# Patient Record
Sex: Male | Born: 1960 | Race: Black or African American | Hispanic: No | Marital: Single | Smoking: Current every day smoker
Health system: Southern US, Community
[De-identification: ages and names within clinical notes are randomized; demographics above are authoritative.]

## PROBLEM LIST (undated history)

## (undated) DIAGNOSIS — M109 Gout, unspecified: Secondary | ICD-10-CM

---

## 2002-11-03 ENCOUNTER — Emergency Department (HOSPITAL_COMMUNITY): Admission: EM | Admit: 2002-11-03 | Discharge: 2002-11-03 | Payer: Self-pay | Admitting: Emergency Medicine

## 2005-01-07 ENCOUNTER — Ambulatory Visit: Payer: Self-pay | Admitting: Family Medicine

## 2005-02-11 ENCOUNTER — Ambulatory Visit: Payer: Self-pay | Admitting: Family Medicine

## 2005-03-07 ENCOUNTER — Emergency Department (HOSPITAL_COMMUNITY): Admission: EM | Admit: 2005-03-07 | Discharge: 2005-03-07 | Payer: Self-pay | Admitting: Emergency Medicine

## 2005-05-31 ENCOUNTER — Emergency Department (HOSPITAL_COMMUNITY): Admission: EM | Admit: 2005-05-31 | Discharge: 2005-06-01 | Payer: Self-pay | Admitting: Emergency Medicine

## 2007-11-13 ENCOUNTER — Emergency Department (HOSPITAL_COMMUNITY): Admission: EM | Admit: 2007-11-13 | Discharge: 2007-11-14 | Payer: Self-pay | Admitting: Emergency Medicine

## 2007-12-19 ENCOUNTER — Emergency Department (HOSPITAL_COMMUNITY): Admission: EM | Admit: 2007-12-19 | Discharge: 2007-12-19 | Payer: Self-pay | Admitting: *Deleted

## 2008-03-28 ENCOUNTER — Ambulatory Visit: Payer: Self-pay | Admitting: Family Medicine

## 2008-03-28 DIAGNOSIS — J309 Allergic rhinitis, unspecified: Secondary | ICD-10-CM | POA: Insufficient documentation

## 2008-12-08 ENCOUNTER — Ambulatory Visit: Payer: Self-pay | Admitting: Family Medicine

## 2008-12-08 DIAGNOSIS — N529 Male erectile dysfunction, unspecified: Secondary | ICD-10-CM | POA: Insufficient documentation

## 2008-12-08 DIAGNOSIS — F17200 Nicotine dependence, unspecified, uncomplicated: Secondary | ICD-10-CM | POA: Insufficient documentation

## 2009-07-17 ENCOUNTER — Ambulatory Visit: Payer: Self-pay | Admitting: Family Medicine

## 2009-07-17 DIAGNOSIS — M65849 Other synovitis and tenosynovitis, unspecified hand: Secondary | ICD-10-CM

## 2009-07-17 DIAGNOSIS — M65839 Other synovitis and tenosynovitis, unspecified forearm: Secondary | ICD-10-CM | POA: Insufficient documentation

## 2009-10-04 ENCOUNTER — Ambulatory Visit: Payer: Self-pay | Admitting: Family Medicine

## 2009-11-09 IMAGING — US US SCROTUM
1 series · 14 of 25 positions shown · non-contrast
Comparison: None

CLINICAL DATA: Right testicular pain, hematuria

SCROTAL ULTRASOUND
DOPPLER ULTRASOUND OF THE TESTICLES
TECHNIQUE: Complete ultrasound examination of the testicles,
epididymis, and other scrotal structures was performed.  Color and
spectral Doppler ultrasound were also utilized to evaluate blood
flow to the testicles.

[Series 1: unknown · 0.11mm/px · 14 of 51 slices shown]
[im 1/51]
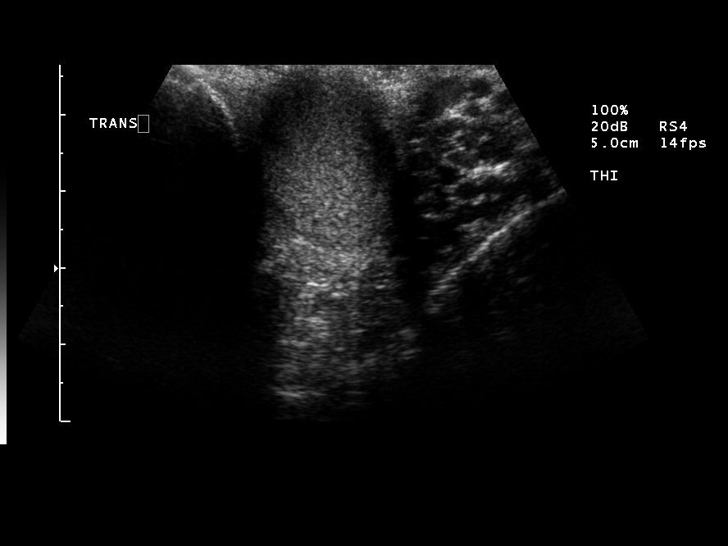
[im 5/51]
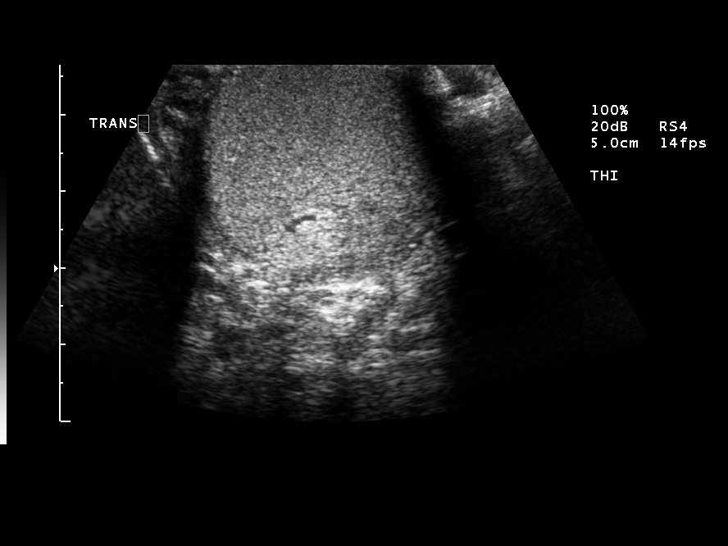
[im 9/51]
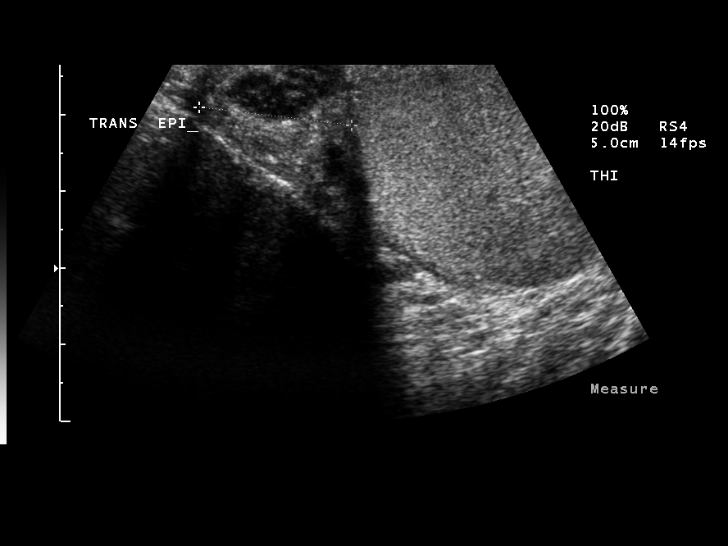
[im 13/51]
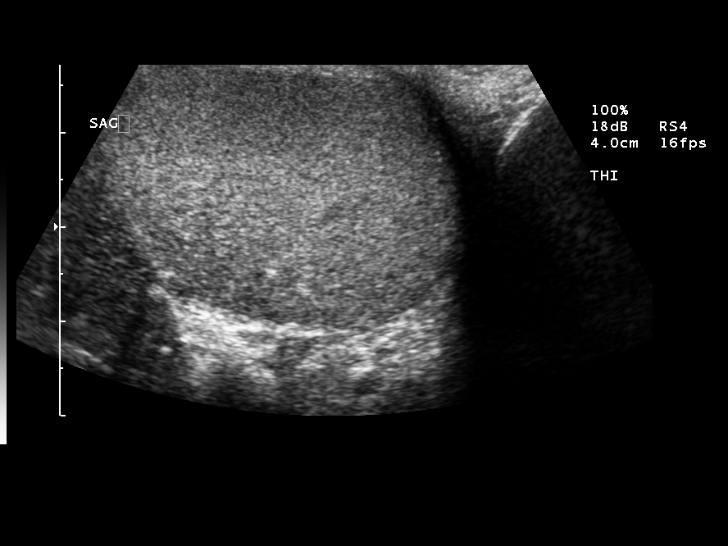
[im 17/51]
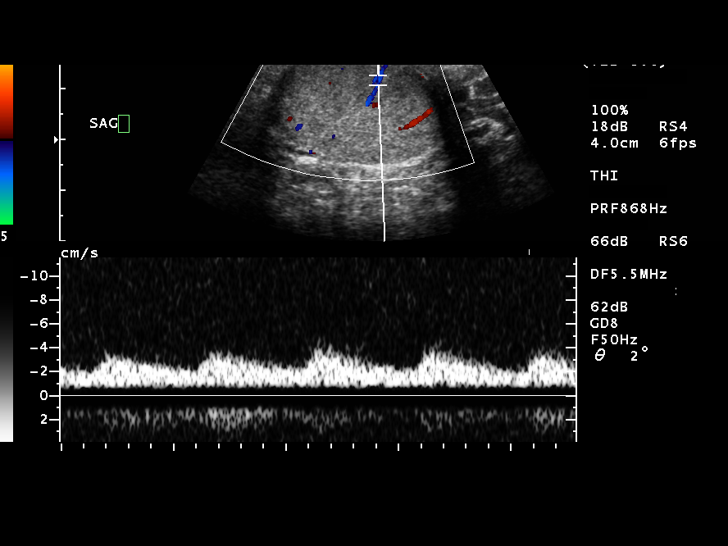
[im 19/51]
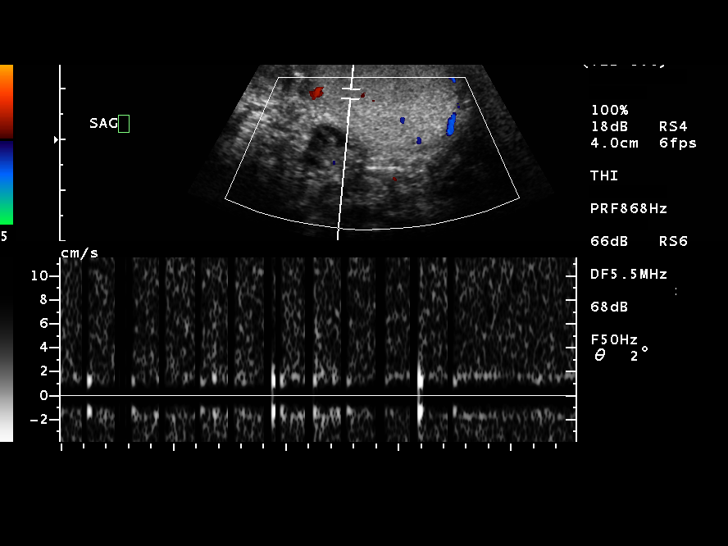
[im 23/51]
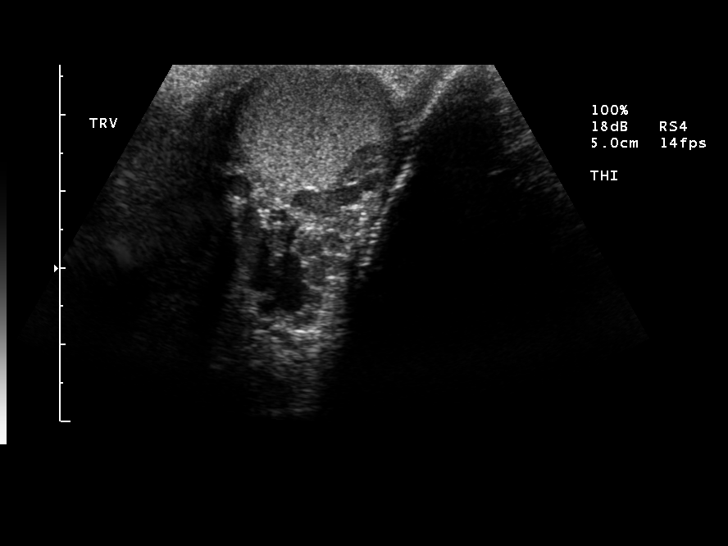
[im 28/51]
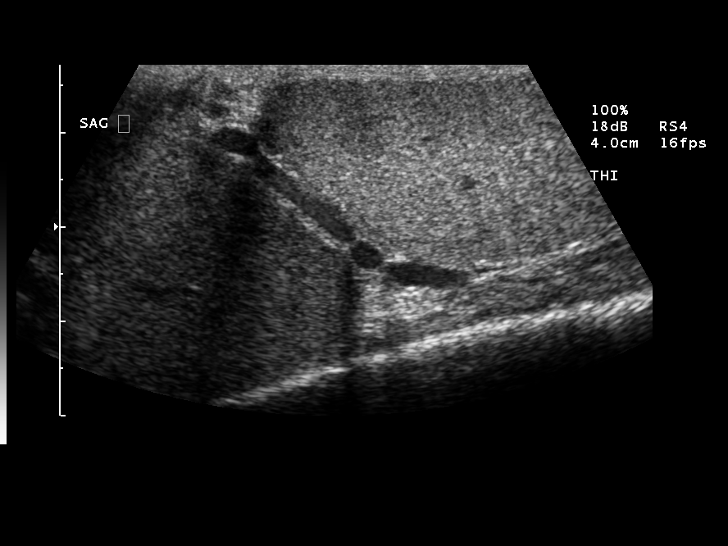
[im 32/51]
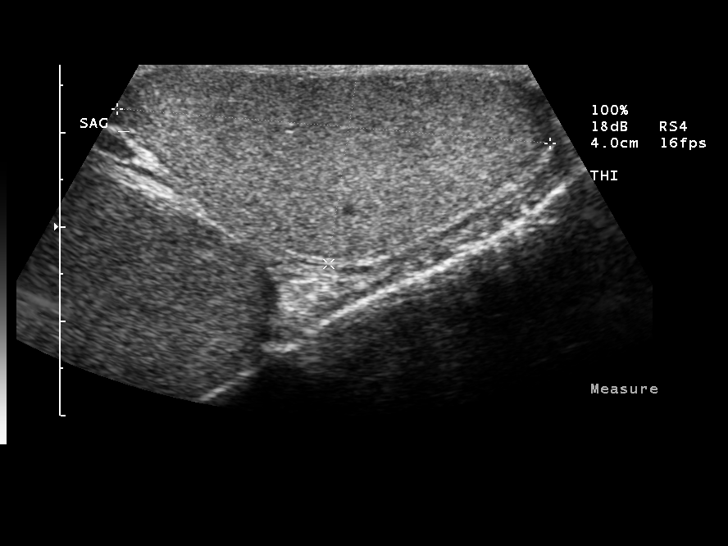
[im 34/51]
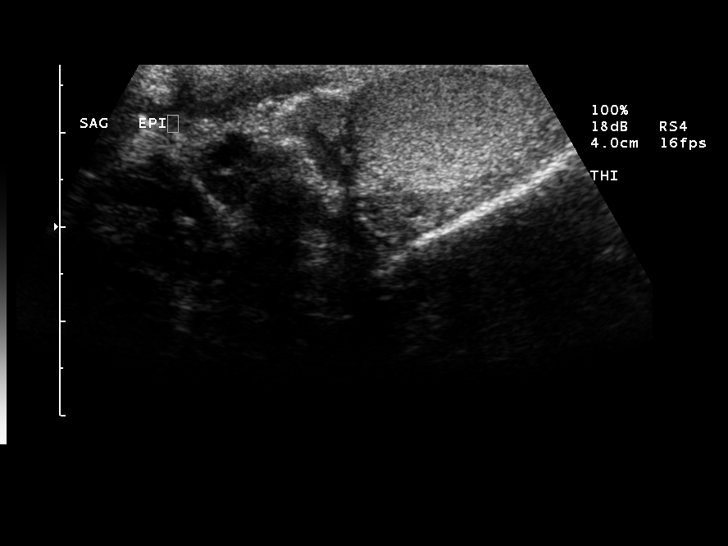
[im 38/51]
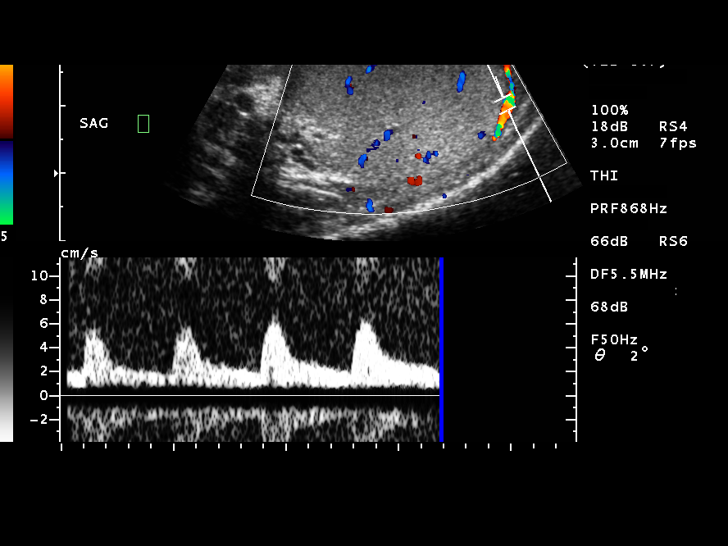
[im 42/51]
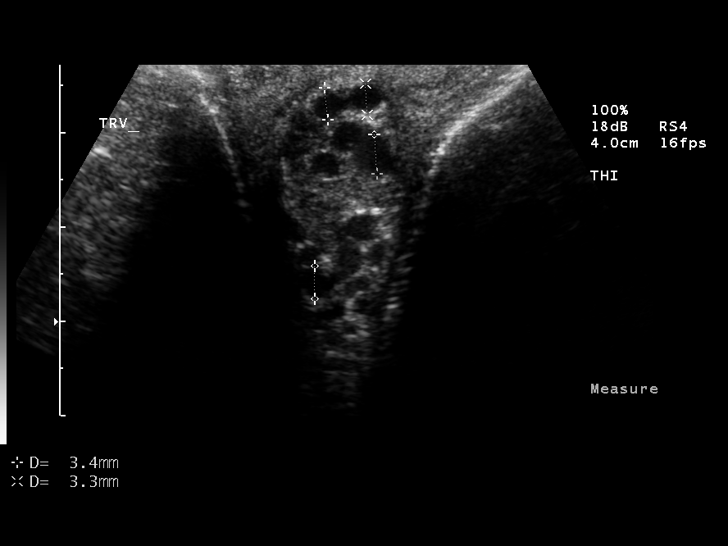
[im 46/51]
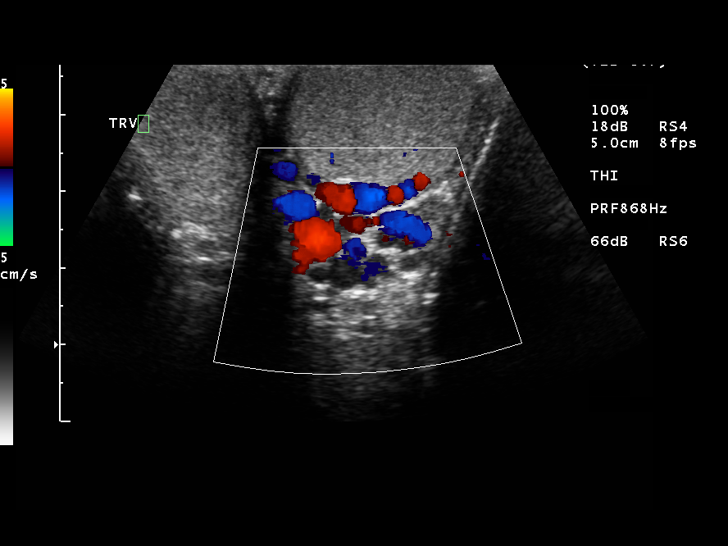
[im 51/51]
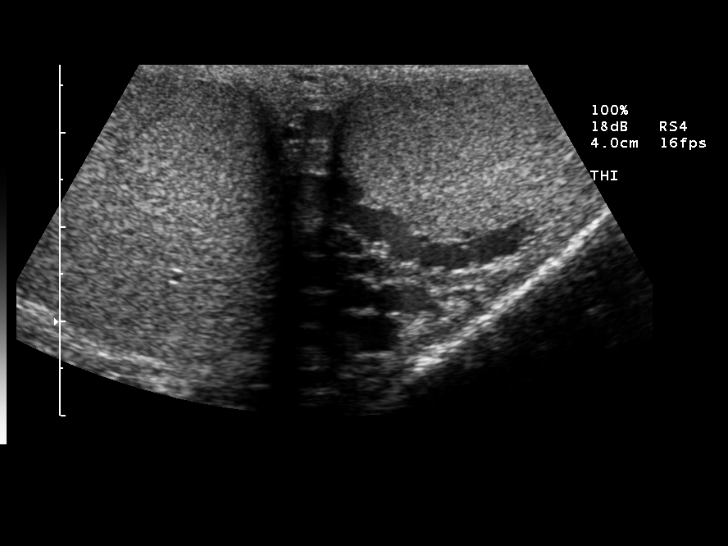

[14 of 25 positions shown; findings below may reference images not displayed]

FINDINGS: The testicles are normal in size and in echogenicity.  No
intratesticular abnormality is seen.  There is blood flow to both
testicles with arterial and venous wave forms demonstrated.  No
abnormality of the epididymis is seen and no hydrocele is noted.
Left varicocele is present.
IMPRESSION: 1.  No acute abnormality.  No intratesticular abnormality. There is
blood flow to both testicles.
2.  Left varicocele.

## 2010-02-05 NOTE — Assessment & Plan Note (Signed)
Summary: Left wrist swelling/cb   Vital Signs:  Patient profile:   50 year old male Weight:      166 pounds Temp:     99.1 degrees F oral BP sitting:   102 / 80  (left arm) Cuff size:   regular  Vitals Entered By: Kern Reap CMA Duncan Dull) (October 04, 2009 2:05 PM) CC: left wrist is swollen Is Patient Diabetic? No Pain Assessment Patient in pain? yes        CC:  left wrist is swollen.  History of Present Illness: luverne is a 71 -year-old male, smoker, who comes in today for evaluation of pain in his left wrist x 6 weeks.  We saw him about 6 weeks ago with pain in his left wrist.  We felt like he had tendinitis and gave him a short course of prednisone.  However, he states the soreness did not go away, and indeed, when he tapered off the prednisone, now it's gotten worse.  He is right-handed.  No history of trauma  Allergies: No Known Drug Allergies  Past History:  Past medical, surgical, family and social histories (including risk factors) reviewed for relevance to current acute and chronic problems.  Family History: Reviewed history and no changes required.  Social History: Reviewed history from 03/28/2008 and no changes required. Occupation: post office Married Never Smoked Alcohol use-no Drug use-no Regular exercise-yes  Review of Systems      See HPI  Physical Exam  General:  Well-developed,well-nourished,in no acute distress; alert,appropriate and cooperative throughout examination Msk:  right hand and wrist normal.  The left hand, extreme tenderness with flexion and extension   Impression & Recommendations:  Problem # 1:  TENDINITIS, LEFT WRIST (ICD-727.05) Assessment Deteriorated  Complete Medication List: 1)  Viagra 50 Mg Tabs (Sildenafil citrate) .... Uad 2)  Vicodin Es 7.5-750 Mg Tabs (Hydrocodone-acetaminophen) .... 1/2 to 1 q 4 hr, as needed  Patient Instructions: 1)  go to the [facility]  orthopedic office tomorrow at 915 for a 930 appointment  with Dr. Cleophas Dunker. 2)  Called Donnie at [phone] 4.  Preliminary data

## 2010-02-05 NOTE — Assessment & Plan Note (Signed)
Summary: WRIST PAIN//ALP   Vital Signs:  Patient profile:   50 year old male Height:      65 inches Weight:      171 pounds BMI:     28.56 Temp:     99.9 degrees F oral BP sitting:   130 / 90  (left arm) Cuff size:   regular  Vitals Entered By: Kern Reap CMA Duncan Dull) (July 17, 2009 12:50 PM) CC: left wrist swollen   CC:  left wrist swollen.  History of Present Illness: Kyle Mcbride is a 50 y/o m comes in today for evaluation of pain and swelling of his left wrist since Sunday.  His work involves heavy Youth worker.  Ununited work around 7 p.m. he was lifting and noticed severe pain in his left wrist.  He did not fall.  He's not had problems like this before.  He states his pain on a scale of one to 10 is a 10 however, he requests light duty.  He states he has to work  Allergies: No Known Drug Allergies  Past History:  Past medical, surgical, family and social histories (including risk factors) reviewed for relevance to current acute and chronic problems.  Family History: Reviewed history and no changes required.  Social History: Reviewed history from 03/28/2008 and no changes required. Occupation: post office Married Never Smoked Alcohol use-no Drug use-no Regular exercise-yes  Review of Systems      See HPI  Physical Exam  General:  Well-developed,well-nourished,in no acute distress; alert,appropriate and cooperative throughout examination Msk:  there is swelling and extreme tenderness of the left wrist.  No evidence of infection Pulses:  R and L carotid,radial,femoral,dorsalis pedis and posterior tibial pulses are full and equal bilaterally Extremities:  No clubbing, cyanosis, edema, or deformity noted with normal full range of motion of all joints.   Neurologic:  No cranial nerve deficits noted. Station and gait are normal. Plantar reflexes are down-going bilaterally. DTRs are symmetrical throughout. Sensory, motor and coordinative functions appear  intact.   Impression & Recommendations:  Problem # 1:  TENDINITIS, LEFT WRIST (ICD-727.05) Assessment New  Complete Medication List: 1)  Chantix Continuing Month Pak 1 Mg Tabs (Varenicline tartrate) .... Uad 2)  Viagra 50 Mg Tabs (Sildenafil citrate) .... Uad 3)  Prednisone 20 Mg Tabs (Prednisone) .... Uad 4)  Vicodin Es 7.5-750 Mg Tabs (Hydrocodone-acetaminophen) .... 1/2 to 1 q 4 hr, as needed  Patient Instructions: 1)  where a short arm splint continuously, prednisone two tabs daily x 3 days, one x 3 days, a half x 3 days, then half a tablet Monday, Wednesday, Friday, for a two week taper. 2)  Elevation and ice as often as possible and when your home, hydrocodone, one half to one tablet every 4 to 6 hours as needed for severe pain.  Return next Tuesday Prescriptions: VICODIN ES 7.5-750 MG TABS (HYDROCODONE-ACETAMINOPHEN) 1/2 to 1 q 4 hr, as needed  #30 x 1   Entered and Authorized by:   Roderick Pee MD   Signed by:   Roderick Pee MD on 07/17/2009   Method used:   Print then Give to Patient   RxID:   [id number] PREDNISONE 20 MG TABS (PREDNISONE) UAD  #30 x 1   Entered and Authorized by:   Roderick Pee MD   Signed by:   Roderick Pee MD on 07/17/2009   Method used:   Print then Give to Patient   RxID:   [id number]

## 2010-10-08 LAB — URINALYSIS, ROUTINE W REFLEX MICROSCOPIC
Glucose, UA: NEGATIVE
Nitrite: NEGATIVE
Protein, ur: NEGATIVE
Specific Gravity, Urine: 1.031 — ABNORMAL HIGH

## 2010-10-08 LAB — POCT I-STAT, CHEM 8
Calcium, Ion: 1.17
Creatinine, Ser: 1.7 — ABNORMAL HIGH
Glucose, Bld: 88
HCT: 46
Hemoglobin: 15.6
Potassium: 3.8
Sodium: 141

## 2010-10-08 LAB — URINE MICROSCOPIC-ADD ON

## 2011-01-03 ENCOUNTER — Ambulatory Visit (INDEPENDENT_AMBULATORY_CARE_PROVIDER_SITE_OTHER): Payer: Federal, State, Local not specified - PPO | Admitting: Family Medicine

## 2011-01-03 ENCOUNTER — Encounter: Payer: Self-pay | Admitting: Family Medicine

## 2011-01-03 VITALS — BP 110/80 | Temp 99.2°F | Ht 65.0 in | Wt 183.0 lb

## 2011-01-03 DIAGNOSIS — M109 Gout, unspecified: Secondary | ICD-10-CM | POA: Insufficient documentation

## 2011-01-03 MED ORDER — HYDROCODONE-ACETAMINOPHEN 7.5-750 MG PO TABS: 1.0000 | ORAL_TABLET | Freq: Four times a day (QID) | ORAL | Status: DC | PRN

## 2011-01-03 MED ORDER — PREDNISONE 20 MG PO TABS: ORAL_TABLET | ORAL | Status: DC

## 2011-01-03 MED ORDER — ALLOPURINOL 300 MG PO TABS: ORAL_TABLET | ORAL | Status: DC

## 2011-01-03 NOTE — Patient Instructions (Signed)
Beginning the prednisone as directed, to stop the acute attack of gout.  To prevent further attacks take the allopurinol, one tablet daily forever.  Elevation ice, and Vicodin one half to one tablet every 4 to 6 hours as needed for severe pain.  Return p.r.n.

## 2011-01-03 NOTE — Progress Notes (Signed)
  Subjective:    Patient ID: Kyle Mcbride, male    DOB: [birth year], 50 y.o.   MRN: [id number]  HPI Kyle Mcbride is a 50 year old male, who comes in today for evaluation of acute gout.  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  It that the medicine for 3 months then stopped it.  Tuesday of this week.  He began having severe pain and swelling in his left wrist.  No history of trauma   Review of Systems General and musculoskeletal review of systems otherwise negative    Objective:   Physical Exam Well-developed well-nourished man in no acute distress, severe pain, left wrist is red, hot, and swollen       Assessment & Plan:  Acute gout begin prednisone, and Vicodin to ameliorate the acute attack been allopurinol one daily forever

## 2011-08-02 ENCOUNTER — Other Ambulatory Visit: Payer: Self-pay | Admitting: Family Medicine

## 2011-09-07 ENCOUNTER — Emergency Department (HOSPITAL_BASED_OUTPATIENT_CLINIC_OR_DEPARTMENT_OTHER)
Admission: EM | Admit: 2011-09-07 | Discharge: 2011-09-07 | Disposition: A | Discharge status: Home / Self Care | Payer: Federal, State, Local not specified - PPO | Attending: Emergency Medicine | Admitting: Emergency Medicine

## 2011-09-07 ENCOUNTER — Emergency Department (HOSPITAL_BASED_OUTPATIENT_CLINIC_OR_DEPARTMENT_OTHER): Payer: Federal, State, Local not specified - PPO

## 2011-09-07 ENCOUNTER — Encounter (HOSPITAL_BASED_OUTPATIENT_CLINIC_OR_DEPARTMENT_OTHER): Payer: Self-pay | Admitting: *Deleted

## 2011-09-07 DIAGNOSIS — S99919A Unspecified injury of unspecified ankle, initial encounter: Secondary | ICD-10-CM | POA: Insufficient documentation

## 2011-09-07 DIAGNOSIS — S93409A Sprain of unspecified ligament of unspecified ankle, initial encounter: Secondary | ICD-10-CM

## 2011-09-07 DIAGNOSIS — Y998 Other external cause status: Secondary | ICD-10-CM | POA: Insufficient documentation

## 2011-09-07 DIAGNOSIS — F17200 Nicotine dependence, unspecified, uncomplicated: Secondary | ICD-10-CM | POA: Insufficient documentation

## 2011-09-07 DIAGNOSIS — S8990XA Unspecified injury of unspecified lower leg, initial encounter: Secondary | ICD-10-CM | POA: Insufficient documentation

## 2011-09-07 DIAGNOSIS — X500XXA Overexertion from strenuous movement or load, initial encounter: Secondary | ICD-10-CM | POA: Insufficient documentation

## 2011-09-07 DIAGNOSIS — Y9301 Activity, walking, marching and hiking: Secondary | ICD-10-CM | POA: Insufficient documentation

## 2011-09-07 MED ORDER — IBUPROFEN 800 MG PO TABS
800.0000 mg | ORAL_TABLET | Freq: Once | ORAL | Status: AC
  Administered 2011-09-07: 800 mg via ORAL
  Filled 2011-09-07: qty 1

## 2011-09-07 NOTE — ED Provider Notes (Signed)
History  This chart was scribed for Kyle Quarry, MD by Erskine Emery. This patient was seen in room MH01/MH01 and the patient's care was started at 20:05.   CSN: [id number]  Arrival date & time 09/07/11  1908   First MD Initiated Contact with Patient 09/07/11 2005      Chief Complaint  Patient presents with  . Ankle Pain    (Consider location/radiation/quality/duration/timing/severity/associated sxs/prior treatment) The history is provided by the patient. No language interpreter was used.  Kyle Mcbride is a 51 y.o. male who presents to the Emergency Department complaining of left ankle pain and swelling since an injury while running last night. Pt has been taking Vicodin with no relief from pain. Pt reports he has been ambulatory on the ankle since the event.  History reviewed. No pertinent past medical history.  History reviewed. No pertinent past surgical history.  History reviewed. No pertinent family history.  History  Substance Use Topics  . Smoking status: Current Everyday Smoker -- 0.5 packs/day  . Smokeless tobacco: Not on file  . Alcohol Use: Yes      Review of Systems  Constitutional: Negative for fever and chills.  Respiratory: Negative for shortness of breath.   Gastrointestinal: Negative for nausea and vomiting.  Musculoskeletal: Positive for joint swelling.       Left ankle pain  Neurological: Negative for weakness.  All other systems reviewed and are negative.    Allergies  Review of patient's allergies indicates not on file.  Home Medications   Current Outpatient Rx  Name Route Sig Dispense Refill  . ALLOPURINOL 300 MG PO TABS  One tab daily forever 100 tablet 3  . HYDROCODONE-ACETAMINOPHEN 7.5-750 MG PO TABS  take 1 tablet by mouth every 6 hours if needed for pain 20 tablet 0  . PREDNISONE 20 MG PO TABS  Three tabs now then two tabs x 3 days, one x 3 days, a half a tab x 3 days, then stop.   30 tablet 1  . SILDENAFIL CITRATE 50 MG PO TABS Oral  Take 50 mg by mouth daily as needed.        BP 116/69  Pulse 76  Temp 99 F (37.2 C) (Oral)  Resp 20  Ht 5\' 5"  (1.651 m)  Wt 155 lb (70.308 kg)  BMI 25.79 kg/m2  SpO2 97%  Physical Exam  Nursing note and vitals reviewed. Constitutional: He is oriented to person, place, and time. He appears well-developed and well-nourished. No distress.  HENT:  Head: Normocephalic and atraumatic.  Eyes: EOM are normal. Pupils are equal, round, and reactive to light.  Neck: Neck supple. No tracheal deviation present.  Cardiovascular: Normal rate.   Pulmonary/Chest: Effort normal. No respiratory distress.  Abdominal: Soft. He exhibits no distension.  Musculoskeletal: Normal range of motion. He exhibits no edema.       Tend over lateral aspect of his left ankle. Good distal pulses. Neurovascular is intact. Ligament laxity is not noted on exam. No proximal injections noted. Shows swelling consistent with a sprain.  Neurological: He is alert and oriented to person, place, and time.  Skin: Skin is warm and dry.  Psychiatric: He has a normal mood and affect.    ED Course  Procedures (including critical care time) DIAGNOSTIC STUDIES: Oxygen Saturation is 97% on room air, adequate by my interpretation.    COORDINATION OF CARE: 20:10--I evaluated the patient and we discussed a treatment plan including crutches, antiinflammatory medication, and resting to which the pt  agreed.    Labs Reviewed - No data to display Dg Ankle Complete Left  09/07/2011  *RADIOLOGY REPORT*  Clinical Data: Twisting injury to the left ankle.  Lateral pain.  LEFT ANKLE COMPLETE - 3+ VIEW  Comparison: None.  Findings: Mild lateral soft tissue swelling.  Large joint effusion. No evidence of acute fracture or dislocation.  Ankle mortise intact with well-preserved joint space.  Tiny plantar calcaneal spur and tiny enthesopathic spur at the insertion of the Achilles tendon on the posterior calcaneus.  IMPRESSION: No acute osseous  abnormality.  Large joint effusion.   Original Report Authenticated By: Arnell Sieving, M.D.      No diagnosis found.    MDM  I personally performed the services described in this documentation, which was scribed in my presence. The recorded information has been reviewed and considered.   Kyle Quarry, MD 09/07/11 [phone]

## 2011-09-07 NOTE — ED Notes (Addendum)
Pt states he twisted his left ankle last p.m. No relief with Vicodin

## 2011-10-09 ENCOUNTER — Ambulatory Visit (INDEPENDENT_AMBULATORY_CARE_PROVIDER_SITE_OTHER): Payer: Federal, State, Local not specified - PPO | Admitting: Family Medicine

## 2011-10-09 ENCOUNTER — Encounter: Payer: Self-pay | Admitting: Family Medicine

## 2011-10-09 VITALS — BP 120/88 | Temp 98.6°F | Wt 181.0 lb

## 2011-10-09 DIAGNOSIS — M109 Gout, unspecified: Secondary | ICD-10-CM

## 2011-10-09 DIAGNOSIS — N529 Male erectile dysfunction, unspecified: Secondary | ICD-10-CM

## 2011-10-09 DIAGNOSIS — F17200 Nicotine dependence, unspecified, uncomplicated: Secondary | ICD-10-CM

## 2011-10-09 MED ORDER — ALLOPURINOL 300 MG PO TABS: ORAL_TABLET | ORAL | Status: DC

## 2011-10-09 MED ORDER — HYDROCODONE-ACETAMINOPHEN 7.5-750 MG PO TABS: 1.0000 | ORAL_TABLET | ORAL | Status: DC | PRN

## 2011-10-09 MED ORDER — PREDNISONE 20 MG PO TABS: ORAL_TABLET | ORAL | Status: DC

## 2011-10-09 NOTE — Patient Instructions (Signed)
Gone home now and elevation and ice  Vicodin,,,,,,,,, one half to one tablet every 4 hours as needed for severe pain  Do not go to work  Prednisone as directed  Allopurinol one tablet daily starting tomorrow morning  Return on Monday for followup

## 2011-10-09 NOTE — Progress Notes (Signed)
  Subjective:    Patient ID: Kyle Mcbride, male    DOB: 1960/04/17, 51 y.o.   MRN: [id number]  HPI Kyle Mcbride is a 51 year old male smoker who comes in today for evaluation of acute gout in his left knee  He states about the 2 weeks ago he had some soreness in his left ankle that lasted for 3 days and went away with a short course of prednisone. Then a week ago he had the sudden onset of pain in his left knee. He's not been taking his allopurinol. No history of trauma. 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   Review of Systems Gen. an orthopedic review of systems otherwise negative    Objective:   Physical Exam Well-developed well-nourished male in no acute distress examination of left ankle normal left knee is swollen and red hot with an obvious effusion       Assessment & Plan:  Acute gout left knee plan elevation ice prednisone Vicodin for pain return on Monday for followup  At that juncture we'll discuss erectile dysfunction and tobacco abuse

## 2011-10-10 ENCOUNTER — Other Ambulatory Visit: Payer: Self-pay | Admitting: Family Medicine

## 2011-10-10 ENCOUNTER — Ambulatory Visit (INDEPENDENT_AMBULATORY_CARE_PROVIDER_SITE_OTHER)
Admission: RE | Admit: 2011-10-10 | Discharge: 2011-10-10 | Disposition: A | Discharge status: Home / Self Care | Payer: Federal, State, Local not specified - PPO | Source: Ambulatory Visit | Attending: Family Medicine | Admitting: Family Medicine

## 2011-10-10 DIAGNOSIS — M25562 Pain in left knee: Secondary | ICD-10-CM

## 2011-10-10 DIAGNOSIS — M25569 Pain in unspecified knee: Secondary | ICD-10-CM

## 2011-10-10 LAB — CBC WITH DIFFERENTIAL/PLATELET
Eosinophils Absolute: 0 10*3/uL (ref 0.0–0.7)
Eosinophils Relative: 0.2 % (ref 0.0–5.0)
HCT: 44.6 % (ref 39.0–52.0)
Lymphocytes Relative: 18.2 % (ref 12.0–46.0)
MCHC: 33.1 g/dL (ref 30.0–36.0)
MCV: 90.6 fl (ref 78.0–100.0)
Monocytes Relative: 5.2 % (ref 3.0–12.0)
Neutrophils Relative %: 76.2 % (ref 43.0–77.0)
Platelets: 232 10*3/uL (ref 150.0–400.0)
RDW: 14.2 % (ref 11.5–14.6)
WBC: 14.3 10*3/uL — ABNORMAL HIGH (ref 4.5–10.5)

## 2011-10-10 LAB — RENAL FUNCTION PANEL
BUN: 16 mg/dL (ref 6–23)
Creatinine, Ser: 1.3 mg/dL (ref 0.4–1.5)
Glucose, Bld: 88 mg/dL (ref 70–99)
Phosphorus: 2.8 mg/dL (ref 2.3–4.6)
Sodium: 137 mEq/L (ref 135–145)

## 2011-10-10 LAB — URIC ACID: Uric Acid, Serum: 7.5 mg/dL (ref 4.0–7.8)

## 2012-02-26 ENCOUNTER — Other Ambulatory Visit: Payer: Self-pay | Admitting: Family Medicine

## 2012-02-27 ENCOUNTER — Telehealth: Payer: Self-pay | Admitting: Family Medicine

## 2012-02-27 MED ORDER — PREDNISONE 10 MG PO TABS: ORAL_TABLET | ORAL | Status: DC

## 2012-02-27 NOTE — Telephone Encounter (Signed)
Pt wants a refill of Prednisone 10 mg. Please send to pharmacy on file, [facility], [address]. Please advise.

## 2012-02-27 NOTE — Telephone Encounter (Signed)
Okay per Dr Tawanna Cooler.  Appointment made.

## 2012-03-02 ENCOUNTER — Encounter: Payer: Self-pay | Admitting: Family Medicine

## 2012-03-02 ENCOUNTER — Ambulatory Visit (INDEPENDENT_AMBULATORY_CARE_PROVIDER_SITE_OTHER): Payer: Federal, State, Local not specified - PPO | Admitting: Family Medicine

## 2012-03-02 ENCOUNTER — Ambulatory Visit: Payer: Federal, State, Local not specified - PPO | Admitting: Family Medicine

## 2012-03-02 ENCOUNTER — Telehealth: Payer: Self-pay | Admitting: Family Medicine

## 2012-03-02 VITALS — BP 120/80

## 2012-03-02 DIAGNOSIS — M109 Gout, unspecified: Secondary | ICD-10-CM

## 2012-03-02 LAB — HEPATIC FUNCTION PANEL
ALT: 30 U/L (ref 0–53)
AST: 23 U/L (ref 0–37)
Total Bilirubin: 0.7 mg/dL (ref 0.3–1.2)
Total Protein: 7.1 g/dL (ref 6.0–8.3)

## 2012-03-02 LAB — CBC WITH DIFFERENTIAL/PLATELET
Basophils Absolute: 0.1 10*3/uL (ref 0.0–0.1)
Basophils Relative: 0.6 % (ref 0.0–3.0)
HCT: 46.3 % (ref 39.0–52.0)
Hemoglobin: 15.4 g/dL (ref 13.0–17.0)
Lymphocytes Relative: 20.1 % (ref 12.0–46.0)
MCHC: 33.2 g/dL (ref 30.0–36.0)
Monocytes Absolute: 0.6 10*3/uL (ref 0.1–1.0)
Platelets: 206 10*3/uL (ref 150.0–400.0)
RDW: 14.8 % — ABNORMAL HIGH (ref 11.5–14.6)

## 2012-03-02 LAB — TSH: TSH: 0.32 u[IU]/mL — ABNORMAL LOW (ref 0.35–5.50)

## 2012-03-02 LAB — BASIC METABOLIC PANEL
CO2: 29 mEq/L (ref 19–32)
Calcium: 9 mg/dL (ref 8.4–10.5)
Potassium: 4.1 mEq/L (ref 3.5–5.1)
Sodium: 139 mEq/L (ref 135–145)

## 2012-03-02 MED ORDER — ALLOPURINOL 300 MG PO TABS: ORAL_TABLET | ORAL | Status: DC

## 2012-03-02 MED ORDER — PREDNISONE 10 MG PO TABS: ORAL_TABLET | ORAL | Status: DC

## 2012-03-02 MED ORDER — HYDROCODONE-ACETAMINOPHEN 7.5-750 MG PO TABS: 1.0000 | ORAL_TABLET | ORAL | Status: DC | PRN

## 2012-03-02 NOTE — Patient Instructions (Addendum)
Take the allopurinol 1 daily as you're currently doing  As long as your taking the allopurinol daily he should not have any gouty attacks  If you do have a gouty attack take a short course of the prednisone and Vicodin as directed  Return in one year sooner if any problems

## 2012-03-02 NOTE — Progress Notes (Signed)
  Subjective:    Patient ID: Kyle Mcbride, male    DOB: 12-12-1960, 52 y.o.   MRN: [id number]  HPI Kyle Mcbride is a 52 year old male who comes in today for evaluation of gout  He takes 300 mg of allopurinol daily and Vicodin and prednisone if he has a flare of his gout. However he's had no flares of gout since he's been taking his medication daily  His blood pressure today is 120/80 he declined other vital signs   Review of Systems Review of systems negative    Objective:   Physical Exam Well-developed well-nourished male no acute distress       Assessment & Plan:  Chronic gout continue allopurinol 300 mg daily prednisone and Vicodin when necessary for breakthrough attacks check labs again encouraged annual physical examinations and a smoking cessation program

## 2012-03-02 NOTE — Telephone Encounter (Signed)
Spoke to pharmacy.

## 2012-03-02 NOTE — Telephone Encounter (Signed)
[facility]  Phone: [phone] Instructions for  predniSONE (DELTASONE) 10 MG tablet 20 tablet   "TAKE 6 TABLETS BY MOUTH NOW,THEN 4 TABLETS FOR 3 DAYS,THEN 2 TABLETS FOR 3 DAYS,THEN 1 TABLET FOR 3 DAYS,THEN 1 TABLET ON MONDAY,WEDNESDAY,A" Also pharm states the quanity you prescribed does NOT add up to the 20 tablets ordered. Pls advise

## 2012-03-09 ENCOUNTER — Ambulatory Visit: Payer: Federal, State, Local not specified - PPO | Admitting: Family Medicine

## 2012-03-28 ENCOUNTER — Emergency Department (HOSPITAL_COMMUNITY)
Admission: EM | Admit: 2012-03-28 | Discharge: 2012-03-28 | Disposition: A | Discharge status: Home / Self Care | Payer: Federal, State, Local not specified - PPO | Attending: Emergency Medicine | Admitting: Emergency Medicine

## 2012-03-28 ENCOUNTER — Encounter (HOSPITAL_COMMUNITY): Payer: Self-pay | Admitting: Emergency Medicine

## 2012-03-28 DIAGNOSIS — L299 Pruritus, unspecified: Secondary | ICD-10-CM | POA: Insufficient documentation

## 2012-03-28 DIAGNOSIS — Y929 Unspecified place or not applicable: Secondary | ICD-10-CM | POA: Insufficient documentation

## 2012-03-28 DIAGNOSIS — F17200 Nicotine dependence, unspecified, uncomplicated: Secondary | ICD-10-CM | POA: Insufficient documentation

## 2012-03-28 DIAGNOSIS — B86 Scabies: Secondary | ICD-10-CM | POA: Insufficient documentation

## 2012-03-28 DIAGNOSIS — Y939 Activity, unspecified: Secondary | ICD-10-CM | POA: Insufficient documentation

## 2012-03-28 DIAGNOSIS — Z79899 Other long term (current) drug therapy: Secondary | ICD-10-CM | POA: Insufficient documentation

## 2012-03-28 DIAGNOSIS — G479 Sleep disorder, unspecified: Secondary | ICD-10-CM | POA: Insufficient documentation

## 2012-03-28 LAB — RPR: RPR Ser Ql: NONREACTIVE

## 2012-03-28 MED ORDER — PERMETHRIN 5 % EX CREA: TOPICAL_CREAM | CUTANEOUS | Status: DC

## 2012-03-28 MED ORDER — HYDROXYZINE HCL 10 MG PO TABS: 10.0000 mg | ORAL_TABLET | Freq: Three times a day (TID) | ORAL | Status: DC | PRN

## 2012-03-28 NOTE — ED Provider Notes (Signed)
  Medical screening examination/treatment/procedure(s) were performed by non-physician practitioner and as supervising physician I was immediately available for consultation/collaboration.    Gerhard Munch, MD 03/28/12 [phone]

## 2012-03-28 NOTE — ED Provider Notes (Signed)
History     CSN: [id number]  Arrival date & time 03/28/12  0346   First MD Initiated Contact with Patient 03/28/12 [phone]      Chief Complaint  Patient presents with  . Insect Bites     (Consider location/radiation/quality/duration/timing/severity/associated sxs/prior treatment) HPI Patient presents to the emergency department with rash to the arms groin penis.  It began 3 days, ago.  Patient, states, that there is itching, that has caused him not to be able to sleep.  Patient denies chest pain, shortness of breath, fever, weakness, dysuria, or abdominal pain.  Patient, states he didn't take any medications prior to arrival for his symptoms.  History reviewed. No pertinent past medical history.  History reviewed. No pertinent past surgical history.  No family history on file.  History  Substance Use Topics  . Smoking status: Current Every Day Smoker -- 0.50 packs/day  . Smokeless tobacco: Never Used  . Alcohol Use: 4.2 oz/week    7 Cans of beer per week      Review of Systems All other systems negative except as documented in the HPI. All pertinent positives and negatives as reviewed in the HPI. Allergies  Review of patient's allergies indicates no known allergies.  Home Medications   Current Outpatient Rx  Name  Route  Sig  Dispense  Refill  . allopurinol (ZYLOPRIM) 300 MG tablet   Oral   Take 300 mg by mouth every morning.         Marland Kitchen HYDROcodone-acetaminophen (VICODIN ES) 7.5-750 MG per tablet   Oral   Take 1 tablet by mouth every 4 (four) hours as needed for pain.           BP 115/76  Pulse 88  Temp(Src) 98.6 F (37 C) (Oral)  Resp 20  Wt 179 lb (81.194 kg)  BMI 29.79 kg/m2  SpO2 96%  Physical Exam  Constitutional: He appears well-developed and well-nourished. No distress.  HENT:  Head: Normocephalic and atraumatic.  Pulmonary/Chest: Effort normal.  Genitourinary:     Musculoskeletal:       Arms: Skin: Skin is warm and dry. Rash noted. Rash  is papular.    ED Course  Procedures (including critical care time)  I still feel that there is a concern for possible syphilis but does not seem the most likely. The patient will be treated for scabies.     MDM          Carlyle Dolly, PA-C 03/28/12 [phone]

## 2012-03-28 NOTE — ED Notes (Signed)
Per pt, he has bites on his arms bilaterally, his groin and his penis. Pt states that they started 3 days ago. Believes it may be bedbugs.

## 2012-03-30 ENCOUNTER — Ambulatory Visit: Payer: Federal, State, Local not specified - PPO | Admitting: Family Medicine

## 2012-06-07 ENCOUNTER — Ambulatory Visit (INDEPENDENT_AMBULATORY_CARE_PROVIDER_SITE_OTHER): Payer: Federal, State, Local not specified - PPO | Admitting: Family Medicine

## 2012-06-07 ENCOUNTER — Encounter: Payer: Self-pay | Admitting: Family Medicine

## 2012-06-07 VITALS — BP 110/80 | Temp 98.9°F | Wt 184.0 lb

## 2012-06-07 DIAGNOSIS — F17200 Nicotine dependence, unspecified, uncomplicated: Secondary | ICD-10-CM

## 2012-06-07 DIAGNOSIS — N529 Male erectile dysfunction, unspecified: Secondary | ICD-10-CM

## 2012-06-07 DIAGNOSIS — J069 Acute upper respiratory infection, unspecified: Secondary | ICD-10-CM

## 2012-06-07 MED ORDER — SILDENAFIL CITRATE 100 MG PO TABS: 50.0000 mg | ORAL_TABLET | Freq: Every day | ORAL | Status: DC | PRN

## 2012-06-07 MED ORDER — VARENICLINE TARTRATE 1 MG PO TABS: ORAL_TABLET | ORAL | Status: DC

## 2012-06-07 NOTE — Progress Notes (Signed)
  Subjective:    Patient ID: Kyle Mcbride, male    DOB: 1960/09/19, 52 y.o.   MRN: [id number]  Kyle Mcbride is a 52 year old single male smoker...... 2 packs today....... Who comes in today for evaluation of 3 problems  For the past 3 days had a sore throat no fever no earache no cough etc.  He continues to smoke 2 packs of cigarettes a day and I've encouraged him over the years to try to stop smoking. Now is willing to try  He also has erectile dysfunction and would like a refill on his Viagra. I explained to him his sexual function will much improved when he gets off the nicotine   Review of Systems    review of systems negative Objective:   Physical Exam  Well-developed well-nourished male no acute distress HEENT were negative except he reeks of tobacco neck was supple no adenopathy      Assessment & Plan:  Viral sore throat treat symptomatically  Tobacco abuse begin smoking cessation program followup in 4 weeks  Erectile dysfunction refill Viagra

## 2012-06-07 NOTE — Patient Instructions (Addendum)
Viagra 100 mg,,,,,,,, one half to one tablet when necessary,,,,,,,,,, [email or web address]  For your viral sore throat drink lots of liquids suck on Chloraseptic and Tylenol 2 tabs 3 times daily when necessary  Begin Chantix,,,,,,,,,, one half tab every morning  Taper as outlined  Followup in 4 weeks

## 2012-07-07 ENCOUNTER — Ambulatory Visit: Payer: Federal, State, Local not specified - PPO | Admitting: Family Medicine

## 2012-07-07 DIAGNOSIS — Z0289 Encounter for other administrative examinations: Secondary | ICD-10-CM

## 2012-08-24 ENCOUNTER — Ambulatory Visit (INDEPENDENT_AMBULATORY_CARE_PROVIDER_SITE_OTHER): Payer: Federal, State, Local not specified - PPO | Admitting: Family Medicine

## 2012-08-24 ENCOUNTER — Encounter: Payer: Self-pay | Admitting: Family Medicine

## 2012-08-24 VITALS — BP 120/88 | Temp 99.9°F | Wt 184.0 lb

## 2012-08-24 DIAGNOSIS — M6740 Ganglion, unspecified site: Secondary | ICD-10-CM

## 2012-08-24 DIAGNOSIS — IMO0002 Reserved for concepts with insufficient information to code with codable children: Secondary | ICD-10-CM

## 2012-08-24 NOTE — Progress Notes (Signed)
Chief Complaint  Patient presents with  . knot on hand    HPI:  Acute visit for knot on hand: -knot on L dorsal hand -noticed 3 days ago, not sure how long ther -doesn't really bother him, no pain, itching or restriction of movement -can't remember any trauma per say  ROS: See pertinent positives and negatives per HPI.  No past medical history on file.  No family history on file.  History   Social History  . Marital Status: Single    Spouse Name: N/A    Number of Children: N/A  . Years of Education: N/A   Social History Main Topics  . Smoking status: Current Every Day Smoker -- 0.50 packs/day  . Smokeless tobacco: Never Used  . Alcohol Use: 4.2 oz/week    7 Cans of beer per week  . Drug Use: No  . Sexual Activity: None   Other Topics Concern  . None   Social History Narrative  . None    Current outpatient prescriptions:allopurinol (ZYLOPRIM) 300 MG tablet, Take 300 mg by mouth every morning., Disp: , Rfl: ;  permethrin (ELIMITE) 5 % cream, Apply to entire body and repeat in 2 weeks, Disp: 60 g, Rfl: 1;  sildenafil (VIAGRA) 100 MG tablet, Take 0.5-1 tablets (50-100 mg total) by mouth daily as needed for erectile dysfunction., Disp: 10 tablet, Rfl: 11 varenicline (CHANTIX CONTINUING MONTH PAK) 1 MG tablet, One half tab by mouth every morning, Disp: 30 tablet, Rfl: 5;  HYDROcodone-acetaminophen (VICODIN ES) 7.5-750 MG per tablet, Take 1 tablet by mouth every 4 (four) hours as needed for pain., Disp: , Rfl: ;  hydrOXYzine (ATARAX/VISTARIL) 10 MG tablet, Take 1 tablet (10 mg total) by mouth 3 (three) times daily as needed for itching., Disp: 30 tablet, Rfl: 0  EXAM:  Filed Vitals:   08/24/12 1041  BP: 120/88  Temp: 99.9 F (37.7 C)    Body mass index is 30.62 kg/(m^2).  GENERAL: vitals reviewed and listed above, alert, oriented, appears well hydrated and in no acute distress  HEENT: atraumatic, conjunttiva clear, no obvious abnormalities on inspection of external  nose and ears  NECK: no obvious masses on inspection  MS: moves all extremities without noticeable abnormality Small mobile soft subcutaneous nodule aprox 4mm in diameter over L dorsal prox fifth mc  PSYCH: pleasant and cooperative, no obvious depression or anxiety  ASSESSMENT AND PLAN:  Discussed the following assessment and plan:  Cyst in hand  -discussed potential etiologies -  likely benign cyst, tx options - prefers to observe -follow up with PCP if bothering him, enlarging and in 2 months -Patient advised to return or notify a doctor immediately if symptoms worsen or persist or new concerns arise.  There are no Patient Instructions on file for this visit.   Kriste Basque R.

## 2012-08-24 NOTE — Patient Instructions (Signed)
-  lesion is likely a cyst  -follow up in 2 months to recheck or if bothers you

## 2013-03-23 ENCOUNTER — Ambulatory Visit (INDEPENDENT_AMBULATORY_CARE_PROVIDER_SITE_OTHER): Payer: Federal, State, Local not specified - PPO | Admitting: Family Medicine

## 2013-03-23 ENCOUNTER — Encounter: Payer: Self-pay | Admitting: Family Medicine

## 2013-03-23 VITALS — BP 120/88 | Temp 98.8°F | Wt 199.0 lb

## 2013-03-23 DIAGNOSIS — R040 Epistaxis: Secondary | ICD-10-CM

## 2013-03-23 NOTE — Progress Notes (Signed)
Pre visit review using our clinic review tool, if applicable. No additional management support is needed unless otherwise documented below in the visit note. 

## 2013-03-23 NOTE — Progress Notes (Signed)
   Subjective:    Patient ID: Kyle Mcbride, male    DOB: 09/24/1960, 53 y.o.   MRN: [id number]  HPI [name] is a 53 year old male who comes in today because he had some very slight bleeding from his left nostril yesterday and is gone today. He denies any history of drug abuse  No history of trauma   Review of Systems    negative Objective:   Physical Exam Well-developed and nourished male no acute distress vital signs stable is afebrile examination nose is normal       Assessment & Plan:  Epistaxis left nares gone reassured normal blood pressure

## 2013-03-23 NOTE — Patient Instructions (Signed)
Return when necessary 

## 2013-03-24 ENCOUNTER — Telehealth: Payer: Self-pay | Admitting: Family Medicine

## 2013-03-24 NOTE — Telephone Encounter (Signed)
Relevant patient education mailed to patient.  

## 2013-04-18 ENCOUNTER — Telehealth: Payer: Self-pay | Admitting: Family Medicine

## 2013-04-18 ENCOUNTER — Encounter: Payer: Self-pay | Admitting: Family Medicine

## 2013-04-18 ENCOUNTER — Ambulatory Visit (INDEPENDENT_AMBULATORY_CARE_PROVIDER_SITE_OTHER): Payer: Federal, State, Local not specified - PPO | Admitting: Family Medicine

## 2013-04-18 VITALS — BP 110/80 | Temp 98.3°F | Wt 192.0 lb

## 2013-04-18 DIAGNOSIS — J309 Allergic rhinitis, unspecified: Secondary | ICD-10-CM

## 2013-04-18 DIAGNOSIS — M722 Plantar fascial fibromatosis: Secondary | ICD-10-CM

## 2013-04-18 DIAGNOSIS — M65849 Other synovitis and tenosynovitis, unspecified hand: Secondary | ICD-10-CM

## 2013-04-18 DIAGNOSIS — M65839 Other synovitis and tenosynovitis, unspecified forearm: Secondary | ICD-10-CM

## 2013-04-18 NOTE — Progress Notes (Signed)
Pre visit review using our clinic review tool, if applicable. No additional management support is needed unless otherwise documented below in the visit note. 

## 2013-04-18 NOTE — Telephone Encounter (Signed)
Pt saw Dr Tawanna Coolerodd today he wanted to know if he need to pick up a rx for mhe went to pharmacy to pick up rx for motrin and if so please call and advise//

## 2013-04-18 NOTE — Telephone Encounter (Signed)
Spoke with patient and he can take OTC motrin

## 2013-04-18 NOTE — Progress Notes (Signed)
   Subjective:    Patient ID: Kyle Mcbride, male    DOB: 03/13/1960, 53 y.o.   MRN: [id number]  HPI Kyle Mcbride is a 53 year old male who comes in today thinking he has gout in his left heel  He states 2 weeks ago he began having pain in his left heel. He has severe pain when he gets out of bed once he walks around he feels little better but it doesn't go away. No history of trauma  He takes allopurinol 300 mg daily to prevent gout   Review of Systems    review of systems negative well-developed well-nourished male no acute distress vital signs stable she is afebrile examination of foot is normal except for some tenderness at the plantar fascia Objective:   Physical Exam  Well-developed well-nourished male no acute distress vital signs stable he is afebrile examination of foot is normal except for some tenderness in the plantar fascia      Assessment & Plan:  Plantar fasciitis........ see instructions

## 2013-04-18 NOTE — Patient Instructions (Signed)
Motrin 600 mg twice daily with food  Elevation and ice 15 minutes before bedtime  Support shoes with cotton socks.......... did not go barefooted  Stretching exercises x30 in the morning before getting out of bed  If after 3-4 weeks she does any improvement call and leave a voicemail for [employer]ras[employer]achel and we'll get you set up for physical therapy

## 2013-04-19 ENCOUNTER — Telehealth: Payer: Self-pay | Admitting: Family Medicine

## 2013-04-19 NOTE — Telephone Encounter (Signed)
Relevant patient education mailed to patient.  

## 2013-04-22 ENCOUNTER — Other Ambulatory Visit: Payer: Self-pay | Admitting: Family Medicine

## 2013-04-26 ENCOUNTER — Telehealth: Payer: Self-pay | Admitting: Family Medicine

## 2013-04-26 NOTE — Telephone Encounter (Signed)
Pt requesting re-fill on allopurinol (ZYLOPRIM) 300 MG tablet Send to [facility] on [address]

## 2013-04-26 NOTE — Telephone Encounter (Signed)
This refill has already been sent to the pharmacy

## 2013-04-26 NOTE — Telephone Encounter (Signed)
allopurinol (ZYLOPRIM) 300 MG tablet 100 tablet 3 04/22/2013     Sig: take 1 tablet by mouth once daily    E-Prescribing Status: Receipt confirmed by pharmacy (04/22/2013 4:06 PM EDT)                Pharmacy

## 2013-10-06 IMAGING — CR DG KNEE COMPLETE 4+V*L*
4 series · 4 of 4 positions shown · non-contrast
Comparison: None.

CLINICAL DATA: History of left knee pain.  No known injury.

LEFT KNEE - COMPLETE 4+ VIEW

[view not recorded (1 of 4)]
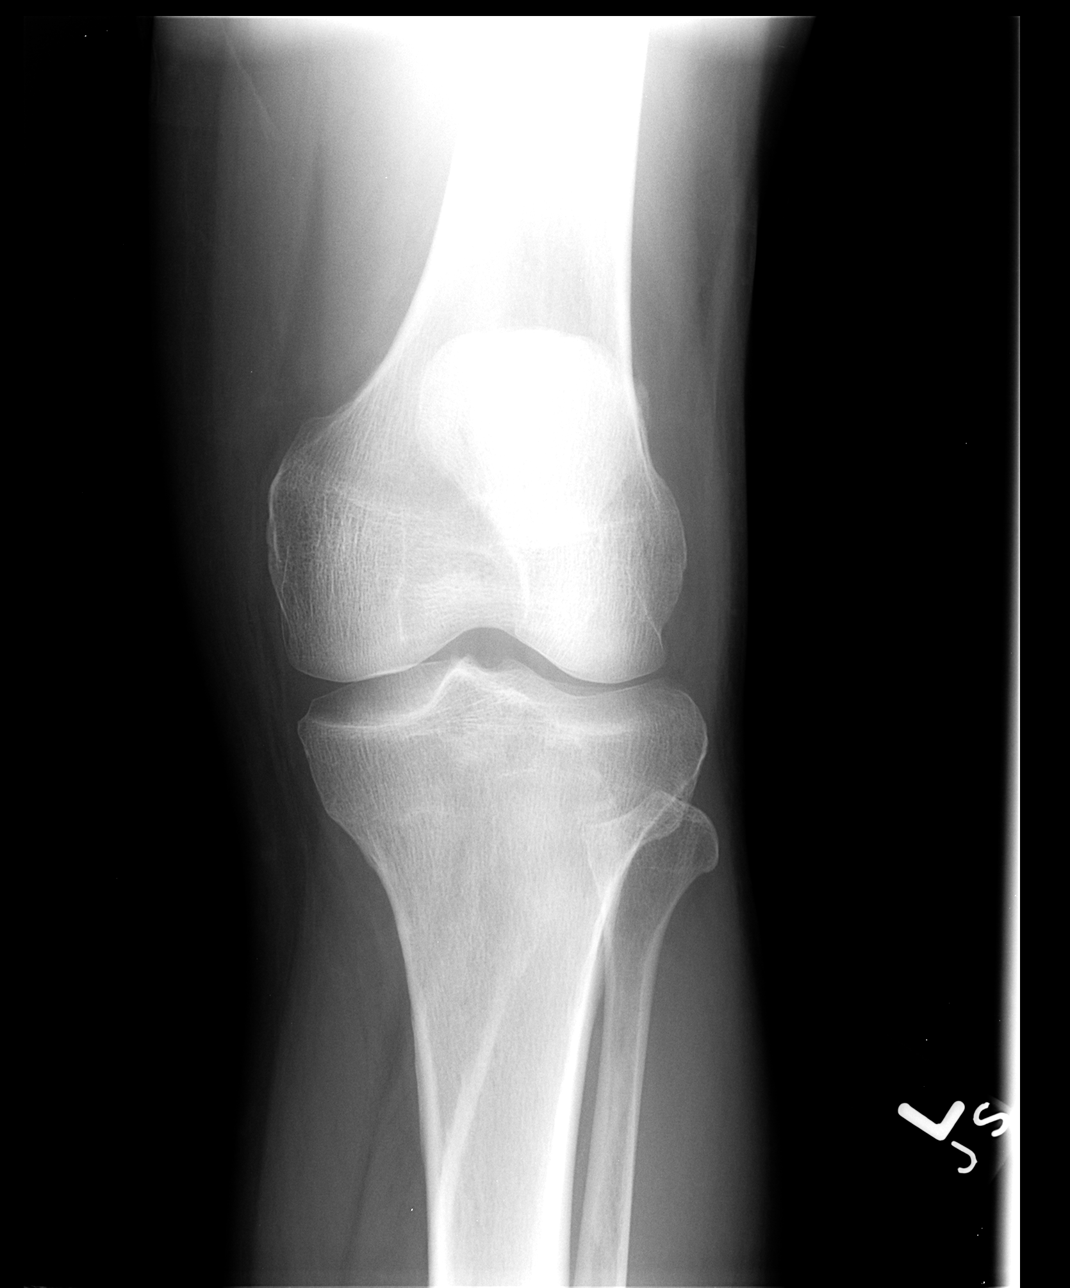

[view not recorded (2 of 4)]
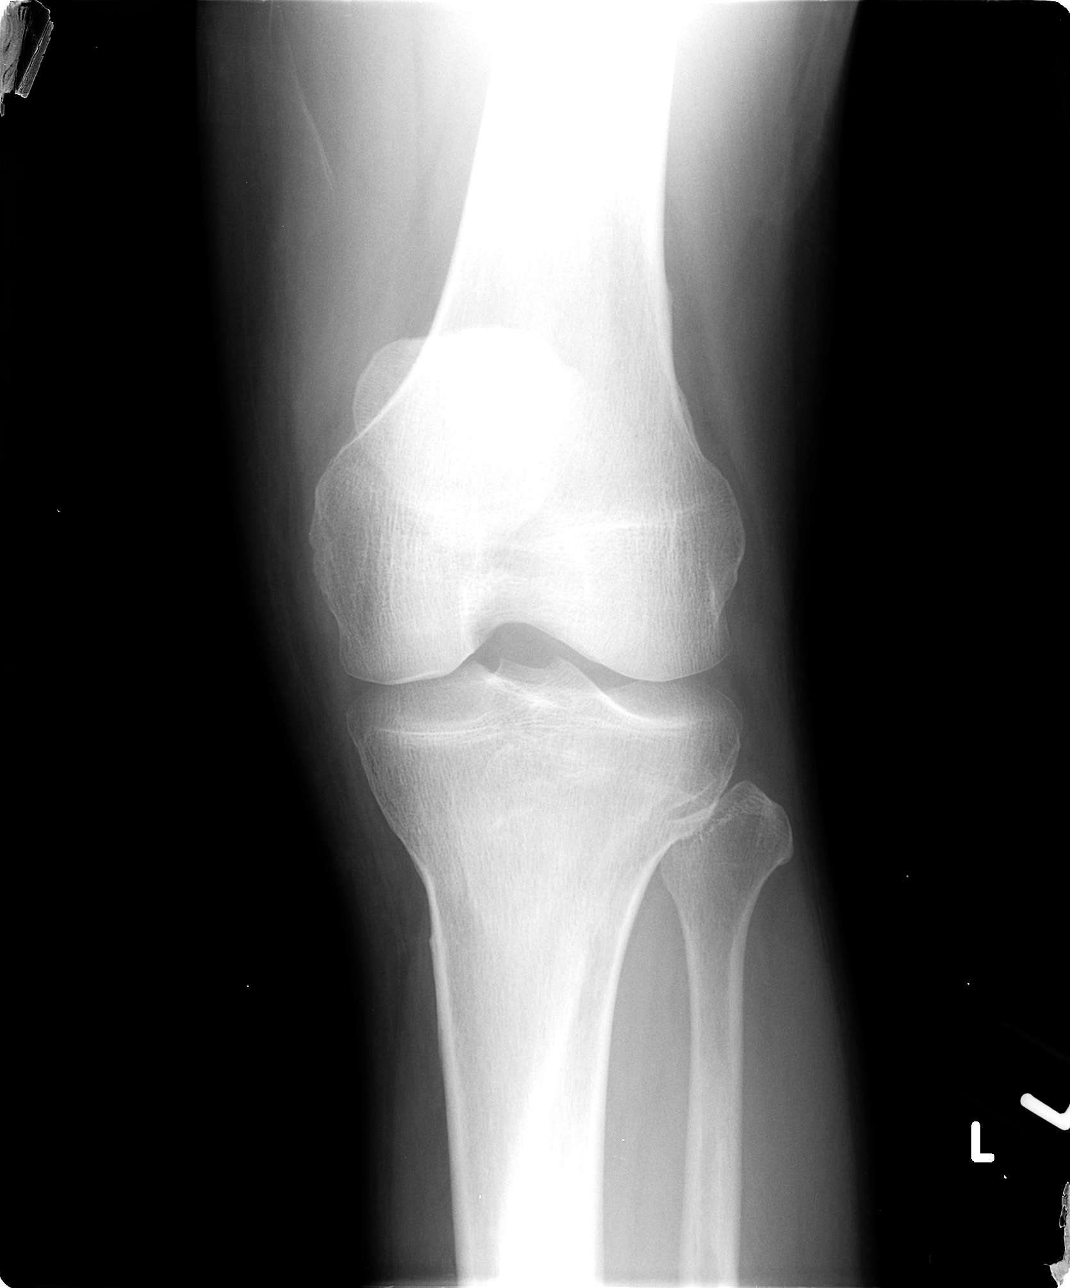

[view not recorded (3 of 4)]
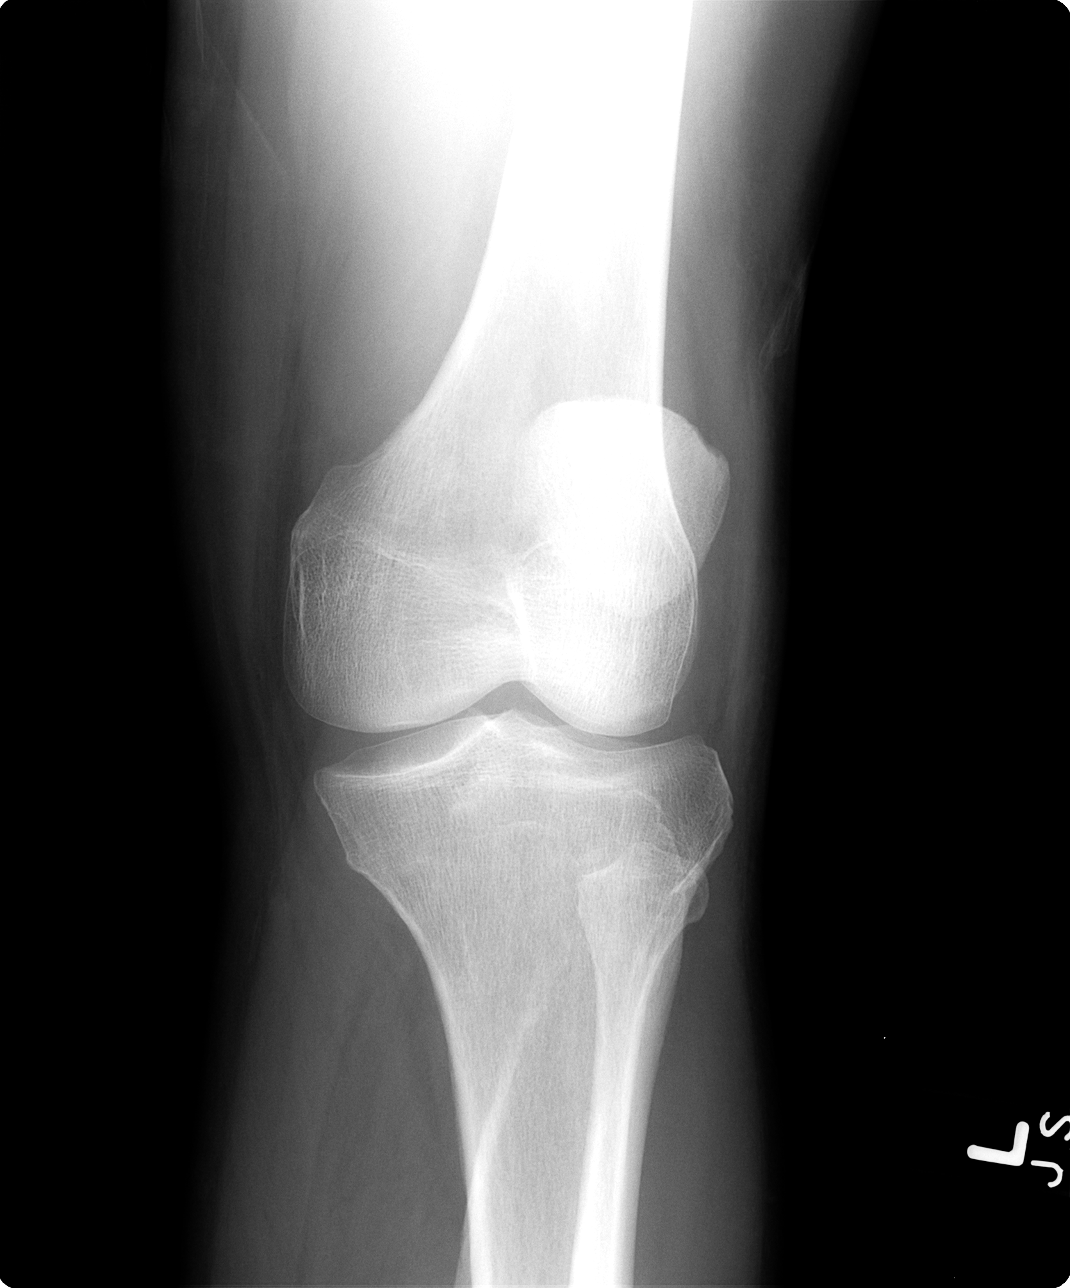

[view not recorded (4 of 4)]
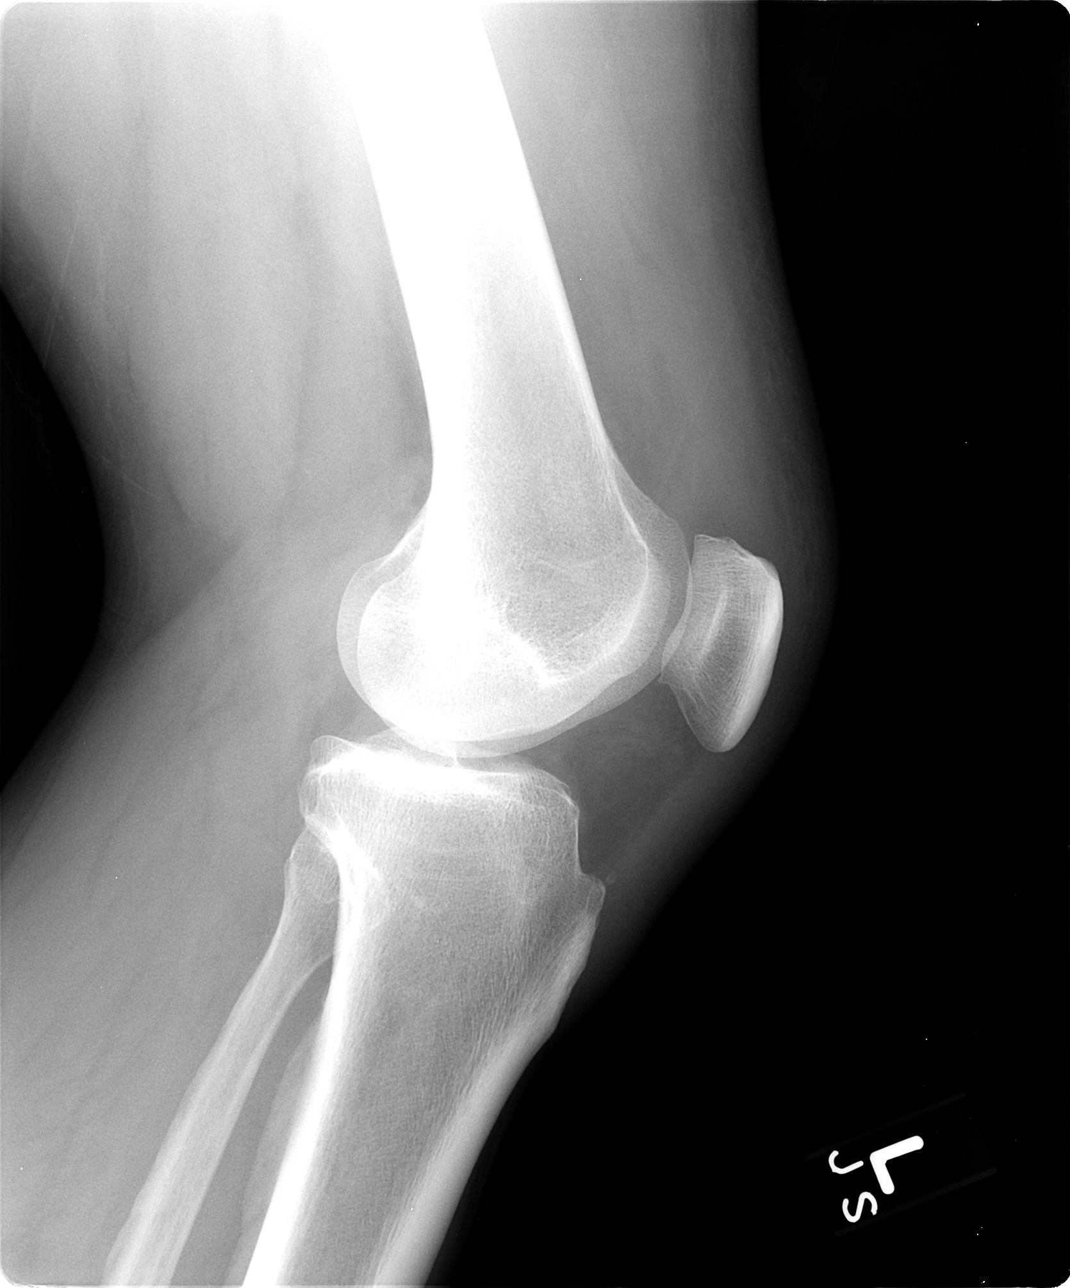

[4 of 4 positions shown; findings below may reference images not displayed]

FINDINGS: On the lateral image there is slight haziness in the
suprapatellar region.  This suggest possible small joint effusion.
Alignment is normal.  Joint spaces are preserved.  No fracture or
dislocation is evident. No chondrocalcinosis or opaque loose body
is evident..  There is minimal beginning patellar spurring.
IMPRESSION: Question small amount of joint effusion.  Minimal beginning
patellar spurring.

## 2013-12-22 ENCOUNTER — Other Ambulatory Visit: Payer: Self-pay | Admitting: Family Medicine

## 2015-08-13 ENCOUNTER — Other Ambulatory Visit: Payer: Self-pay | Admitting: Family Medicine

## 2015-08-13 NOTE — Telephone Encounter (Signed)
Rx refill sent to pharmacy. 

## 2016-01-08 ENCOUNTER — Other Ambulatory Visit: Payer: Self-pay | Admitting: Family Medicine

## 2016-01-09 ENCOUNTER — Telehealth: Payer: Self-pay | Admitting: Family Medicine

## 2016-01-09 NOTE — Telephone Encounter (Signed)
Patient needs a refill on his gout medicine (Allopurinol- 300 mg tablet).  Pharmacy:  [facility] on [address]

## 2016-01-10 NOTE — Telephone Encounter (Signed)
Pt would like a refill for Allopurinol-300 mg. Pt hasn't been seen since 2015

## 2016-01-14 ENCOUNTER — Other Ambulatory Visit: Payer: Self-pay | Admitting: Emergency Medicine

## 2016-01-14 MED ORDER — ALLOPURINOL 300 MG PO TABS: 300.0000 mg | ORAL_TABLET | Freq: Every day | ORAL | 3 refills | Status: DC

## 2016-01-14 NOTE — Telephone Encounter (Signed)
Allopurinol 300 mg dispense 100 tabs refills 3 directions 1 daily

## 2016-01-14 NOTE — Telephone Encounter (Signed)
Prescription sent in  

## 2016-01-24 ENCOUNTER — Encounter (HOSPITAL_COMMUNITY): Payer: Self-pay | Admitting: Emergency Medicine

## 2016-01-24 ENCOUNTER — Emergency Department (HOSPITAL_COMMUNITY)
Admission: EM | Admit: 2016-01-24 | Discharge: 2016-01-24 | Disposition: A | Discharge status: Home / Self Care | Payer: Federal, State, Local not specified - PPO | Attending: Emergency Medicine | Admitting: Emergency Medicine

## 2016-01-24 DIAGNOSIS — M25532 Pain in left wrist: Secondary | ICD-10-CM | POA: Diagnosis present

## 2016-01-24 DIAGNOSIS — F17200 Nicotine dependence, unspecified, uncomplicated: Secondary | ICD-10-CM | POA: Diagnosis not present

## 2016-01-24 DIAGNOSIS — Z79899 Other long term (current) drug therapy: Secondary | ICD-10-CM | POA: Diagnosis not present

## 2016-01-24 DIAGNOSIS — M109 Gout, unspecified: Secondary | ICD-10-CM | POA: Diagnosis not present

## 2016-01-24 HISTORY — DX: Gout, unspecified: M10.9

## 2016-01-24 MED ORDER — COLCHICINE 0.6 MG PO TABS: 0.6000 mg | ORAL_TABLET | ORAL | 0 refills | Status: DC | PRN

## 2016-01-24 MED ORDER — OXYCODONE-ACETAMINOPHEN 5-325 MG PO TABS
2.0000 | ORAL_TABLET | ORAL | 0 refills | Status: DC | PRN
Start: 2016-01-24 — End: 2016-12-02

## 2016-01-24 NOTE — Discharge Instructions (Signed)
Read the information below.  Use the prescribed medication as directed.  Please discuss all new medications with your pharmacist.  Do not take additional tylenol while taking the prescribed pain medication to avoid overdose.  You may return to the Emergency Department at any time for worsening condition or any new symptoms that concern you.    If you develop uncontrolled pain, weakness or numbness of the extremity, severe discoloration of the skin, or you are unable to use your hand, return to the ER for a recheck.    °

## 2016-01-24 NOTE — ED Provider Notes (Signed)
[facility]ENCY DEPT Provider Note   CSN: [id number] Arrival date & time: 01/24/16  1051     History   Chief Complaint Chief Complaint  Patient presents with  . Hand Pain    HPI Kyle Mcbride is a 56 y.o. male.  HPI   Pt with hx gout presents with left wrist pain and swelling that feels like his previous gout.  Pain is severe, constant, radiates into his upper arm and hand.  Denies fever, recent illness or infection, any injury to the arm.  Takes allopurinol daily but has been eating a lot of cheese. No other joints involved.  Usually has gout in his wrist, occasionally in his ankle.    Past Medical History:  Diagnosis Date  . Gout     Patient Active Problem List   Diagnosis Date Noted  . Plantar fasciitis of left foot 04/18/2013  . Frequent nosebleeds 03/23/2013  . Viral URI 06/07/2012  . Gout attack 01/03/2011  . TENDINITIS, LEFT WRIST 07/17/2009  . TOBACCO ABUSE 12/08/2008  . 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 ALLERGIC RHINITIS 03/28/2008    History reviewed. No pertinent surgical history.     Home Medications    Prior to Admission medications   Medication Sig Start Date End Date Taking? Authorizing Provider  allopurinol (ZYLOPRIM) 300 MG tablet Take 1 tablet (300 mg total) by mouth daily. 01/14/16   Roderick Pee, MD  colchicine 0.6 MG tablet Take 1 tablet (0.6 mg total) by mouth as needed. 2 tablets PO once, followed by 1 tablet 1 hour later.  Max 1.8mg  over 1 hour 01/24/16   Trixie Dredge, PA-C  HYDROcodone-acetaminophen (VICODIN ES) 7.5-750 MG per tablet Take 1 tablet by mouth every 4 (four) hours as needed for pain. 03/02/12   Roderick Pee, MD  oxyCODONE-acetaminophen (PERCOCET/ROXICET) 5-325 MG tablet Take 2 tablets by mouth every 4 (four) hours as needed for severe pain. 01/24/16   Trixie Dredge, PA-C  VIAGRA 100 MG tablet take 1/2 take 1 tablet by mouth once daily if needed for ERECTILE DYSFUNCTION 08/13/15   Roderick Pee, MD    Family History History  reviewed. No pertinent family history.  Social History Social History  Substance Use Topics  . Smoking status: Current Every Day Smoker    Packs/day: 0.50  . Smokeless tobacco: Never Used  . Alcohol use 4.2 oz/week    7 Cans of beer per week     Allergies   Patient has no known allergies.   Review of Systems Review of Systems  Constitutional: Negative for chills and fever.  Musculoskeletal: Positive for arthralgias.  Skin: Positive for color change.  Allergic/Immunologic: Negative for immunocompromised state.  Neurological: Negative for weakness and numbness.  Hematological: Does not bruise/bleed easily.  Psychiatric/Behavioral: Negative for self-injury.     Physical Exam Updated Vital Signs BP (!) 128/105 (BP Location: Left Arm)   Pulse 90   Temp 98.8 F (37.1 C) (Oral)   Resp 18   Ht 5\' 5"  (1.651 m)   Wt 86.9 kg   SpO2 98%   BMI 31.88 kg/m   Physical Exam  Constitutional: He appears well-developed and well-nourished. No distress.  HENT:  Head: Normocephalic and atraumatic.  Neck: Neck supple.  Pulmonary/Chest: Effort normal.  Musculoskeletal:  Left wrist with erythema, edema, warmth, tenderness to palpation, decreased ROM.  Full active range of motion of all digits, strength 5/5, sensation intact, capillary refill < 2 seconds.   No other tenderness throughout left upper extremity.  No other joints affected.    Neurological: He is alert.  Skin: He is not diaphoretic.  Nursing note and vitals reviewed.    ED Treatments / Results  Labs (all labs ordered are listed, but only abnormal results are displayed) Labs Reviewed - No data to display  EKG  EKG Interpretation None       Radiology No results found.  Procedures Procedures (including critical care time)  Medications Ordered in ED Medications - No data to display   Initial Impression / Assessment and Plan / ED Course  I have reviewed the triage vital signs and the nursing  notes.  Pertinent labs & imaging results that were available during my care of the patient were reviewed by me and considered in my medical decision making (see chart for details).     Afebrile, nontoxic patient with left wrist pain the feel like previous gout.  No other concerning features.  Neurovascularly intact.  Discussed diet for gout.   Doubt septic joint.   D/C home with colchicine, percocet.   Discussed result, findings, treatment, and follow up  with patient.  Pt given return precautions.  Pt verbalizes understanding and agrees with plan.       Final Clinical Impressions(s) / ED Diagnoses   Final diagnoses:  Acute gout of left wrist, unspecified cause    New Prescriptions New Prescriptions   COLCHICINE 0.6 MG TABLET    Take 1 tablet (0.6 mg total) by mouth as needed. 2 tablets PO once, followed by 1 tablet 1 hour later.  Max 1.8mg  over 1 hour   OXYCODONE-ACETAMINOPHEN (PERCOCET/ROXICET) 5-325 MG TABLET    Take 2 tablets by mouth every 4 (four) hours as needed for severe pain.     Trixie Dredgemily Hanah Moultry, PA-C 01/24/16 1249    Doug SouSam Jacubowitz, MD 01/24/16 1521

## 2016-01-24 NOTE — ED Notes (Signed)
Pt ambulatory at DC. Pt verbalized understanding of DC teaching and medications. NAD.

## 2016-01-24 NOTE — ED Triage Notes (Signed)
Pt sts left hand pain x 1 day; pt sts hx of gout unsure if could be the same

## 2016-08-28 ENCOUNTER — Telehealth: Payer: Self-pay | Admitting: Family Medicine

## 2016-08-28 NOTE — Telephone Encounter (Signed)
Called pt left a message for pt to return my call in the office in regards to the medication request for Viagra, pt needs an OV with Dr Tawanna Cooler.

## 2016-08-28 NOTE — Telephone Encounter (Signed)
Pt has not been seen since 04/2013.  After 3 yrs pt is considered a new pt. Wants to know if Dr Tawanna Cooler will accept him back? Please advise, thanks

## 2016-09-17 NOTE — Telephone Encounter (Signed)
Kyle CoolerNancy Mcbride Toni and explained to him because of my very limited hours will need to find him a new physician. He can either pick one here or one from another office

## 2016-09-23 NOTE — Telephone Encounter (Signed)
Called pt left a message for pt to return my call in the office. 

## 2016-10-14 NOTE — Telephone Encounter (Signed)
Called pt and LMOVM to return call.  °

## 2016-10-20 NOTE — Telephone Encounter (Signed)
Attempted to call pt left a detailed message ask pt return my call in the office.

## 2016-10-23 NOTE — Telephone Encounter (Signed)
Called pt x4 in regards to his request, left a detailed message explaining pt to establish with a dr who is available full time since dr Tawanna Coolerodd is at the office 21/2 days a week per dr Tawanna Coolerodd recommendation.Requested pt to call the office if have any questions.

## 2016-11-26 NOTE — Telephone Encounter (Signed)
Spoke with pt scheduled an appointment with Dr Salomon FickBanks on 12/02/2016 at 3 pm for a transfer from dr Tawanna Coolerodd.

## 2016-12-02 ENCOUNTER — Ambulatory Visit (INDEPENDENT_AMBULATORY_CARE_PROVIDER_SITE_OTHER): Payer: Federal, State, Local not specified - PPO | Admitting: Family Medicine

## 2016-12-02 ENCOUNTER — Encounter: Payer: Self-pay | Admitting: Family Medicine

## 2016-12-02 VITALS — BP 110/80 | HR 86 | Temp 98.6°F | Ht 67.25 in | Wt 194.9 lb

## 2016-12-02 DIAGNOSIS — N529 Male erectile dysfunction, unspecified: Secondary | ICD-10-CM

## 2016-12-02 DIAGNOSIS — Z720 Tobacco use: Secondary | ICD-10-CM

## 2016-12-02 DIAGNOSIS — M1A0320 Idiopathic chronic gout, left wrist, without tophus (tophi): Secondary | ICD-10-CM

## 2016-12-02 MED ORDER — ALLOPURINOL 300 MG PO TABS: 300.0000 mg | ORAL_TABLET | Freq: Every day | ORAL | 3 refills | Status: DC

## 2016-12-02 MED ORDER — SILDENAFIL CITRATE 100 MG PO TABS: ORAL_TABLET | ORAL | 6 refills | Status: DC

## 2016-12-02 NOTE — Patient Instructions (Addendum)
Coping with Quitting Smoking Quitting smoking is a physical and mental challenge. You will face cravings, withdrawal symptoms, and temptation. Before quitting, work with your health care provider to make a plan that can help you cope. Preparation can help you quit and keep you from giving in. How can I cope with cravings? Cravings usually last for 5-10 minutes. If you get through it, the craving will pass. Consider taking the following actions to help you cope with cravings:  Keep your mouth busy: ? Chew sugar-free gum. ? Suck on hard candies or a straw. ? Brush your teeth.  Keep your hands and body busy: ? Immediately change to a different activity when you feel a craving. ? Squeeze or play with a ball. ? Do an activity or a hobby, like making bead jewelry, practicing needlepoint, or working with wood. ? Mix up your normal routine. ? Take a short exercise break. Go for a quick walk or run up and down stairs. ? Spend time in public places where smoking is not allowed.  Focus on doing something kind or helpful for someone else.  Call a friend or family member to talk during a craving.  Join a support group.  Call a quit line, such as 1-800-QUIT-NOW.  Talk with your health care provider about medicines that might help you cope with cravings and make quitting easier for you.  How can I deal with withdrawal symptoms? Your body may experience negative effects as it tries to get used to not having nicotine in the system. These effects are called withdrawal symptoms. They may include:  Feeling hungrier than normal.  Trouble concentrating.  Irritability.  Trouble sleeping.  Feeling depressed.  Restlessness and agitation.  Craving a cigarette.  To manage withdrawal symptoms:  Avoid places, people, and activities that trigger your cravings.  Remember why you want to quit.  Get plenty of sleep.  Avoid coffee and other caffeinated drinks. These may worsen some of your  symptoms.  How can I handle social situations? Social situations can be difficult when you are quitting smoking, especially in the first few weeks. To manage this, you can:  Avoid parties, bars, and other social situations where people might be smoking.  Avoid alcohol.  Leave right away if you have the urge to smoke.  Explain to your family and friends that you are quitting smoking. Ask for understanding and support.  Plan activities with friends or family where smoking is not an option.  What are some ways I can cope with stress? Wanting to smoke may cause stress, and stress can make you want to smoke. Find ways to manage your stress. Relaxation techniques can help. For example:  Breathe slowly and deeply, in through your nose and out through your mouth.  Listen to soothing, relaxing music.  Talk with a family member or friend about your stress.  Light a candle.  Soak in a bath or take a shower.  Think about a peaceful place.  What are some ways I can prevent weight gain? Be aware that many people gain weight after they quit smoking. However, not everyone does. To keep from gaining weight, have a plan in place before you quit and stick to the plan after you quit. Your plan should include:  Having healthy snacks. When you have a craving, it may help to: ? Eat plain popcorn, crunchy carrots, celery, or other cut vegetables. ? Chew sugar-free gum.  Changing how you eat: ? Eat small portion sizes at meals. ?   Eat 4-6 small meals throughout the day instead of 1-2 large meals a day. ? Be mindful when you eat. Do not watch television or do other things that might distract you as you eat.  Exercising regularly: ? Make time to exercise each day. If you do not have time for a long workout, do short bouts of exercise for 5-10 minutes several times a day. ? Do some form of strengthening exercise, like weight lifting, and some form of aerobic exercise, like running or  swimming.  Drinking plenty of water or other low-calorie or no-calorie drinks. Drink 6-8 glasses of water daily, or as much as instructed by your health care provider.  Summary  Quitting smoking is a physical and mental challenge. You will face cravings, withdrawal symptoms, and temptation to smoke again. Preparation can help you as you go through these challenges.  You can cope with cravings by keeping your mouth busy (such as by chewing gum), keeping your body and hands busy, and making calls to family, friends, or a helpline for people who want to quit smoking.  You can cope with withdrawal symptoms by avoiding places where people smoke, avoiding drinks with caffeine, and getting plenty of rest.  Ask your health care provider about the different ways to prevent weight gain, avoid stress, and handle social situations. This information is not intended to replace advice given to you by your health care provider. Make sure you discuss any questions you have with your health care provider. Document Released: 12/21/2015 Document Revised: 12/21/2015 Document Reviewed: 12/21/2015 Elsevier Interactive Patient Education  2018 [employer]  Gout Gout is painful swelling that can happen in some of your joints. Gout is a type of arthritis. This condition is caused by having too much uric acid in your body. Uric acid is a chemical that is made when your body breaks down substances called purines. If your body has too much uric acid, sharp crystals can form and build up in your joints. This causes pain and swelling. Gout attacks can happen quickly and be very painful (acute gout). Over time, the attacks can affect more joints and happen more often (chronic gout). Follow these instructions at home: During a Gout Attack  If directed, put ice on the painful area: ? Put ice in a plastic bag. ? Place a towel between your skin and the bag. ? Leave the ice on for 20 minutes, 2-3 times a day.  Rest the  joint as much as possible. If the joint is in your leg, you may be given crutches to use.  Raise (elevate) the painful joint above the level of your heart as often as you can.  Drink enough fluids to keep your pee (urine) clear or pale yellow.  Take over-the-counter and prescription medicines only as told by your doctor.  Do not drive or use heavy machinery while taking prescription pain medicine.  Follow instructions from your doctor about what you can or cannot eat and drink.  Return to your normal activities as told by your doctor. Ask your doctor what activities are safe for you. Avoiding Future Gout Attacks  Follow a low-purine diet as told by a specialist (dietitian) or your doctor. Avoid foods and drinks that have a lot of purines, such as: ? Liver. ? Kidney. ? Anchovies. ? Asparagus. ? Herring. ? Mushrooms ? Mussels. ? Beer.  Limit alcohol intake to no more than 1 drink a day for nonpregnant women and 2 drinks a day for men. One drink  equals 12 oz of beer, 5 oz of wine, or 1 oz of hard liquor.  Stay at a healthy weight or lose weight if you are overweight. If you want to lose weight, talk with your doctor. It is important that you do not lose weight too fast.  Start or continue an exercise plan as told by your doctor.  Drink enough fluids to keep your pee clear or pale yellow.  Take over-the-counter and prescription medicines only as told by your doctor.  Keep all follow-up visits as told by your doctor. This is important. Contact a doctor if:  You have another gout attack.  You still have symptoms of a gout attack after10 days of treatment.  You have problems (side effects) because of your medicines.  You have chills or a fever.  You have burning pain when you pee (urinate).  You have pain in your lower back or belly. Get help right away if:  You have very bad pain.  Your pain cannot be controlled.  You cannot pee. This information is not intended to  replace advice given to you by your health care provider. Make sure you discuss any questions you have with your health care provider. Document Released: 10/02/2007 Document Revised: 05/31/2015 Document Reviewed: 10/05/2014 Elsevier Interactive Patient Education  [employer]2018 [employer] Inc[employer]

## 2016-12-02 NOTE — Progress Notes (Signed)
Patient presents to clinic today to f/u.  SUBJECTIVE: PMH:  Pt is a 56 yo male with pmh sig for gout, L wrist tendonitis, ED tobacco abuse.  Pt was seen several yrs ago by Dr. Tawanna Coolerodd.  Gout: -Taking allopurinol 300 mg daily -Last attack was several months ago -Left hand/wrist mostly affected -Patient states he was unaware of foods to avoid. -Patient drinking maybe 1 glass of water per day  ED: -Patient requesting refill on Viagra 100 mg  Insomnia: -PTSD likely contributory -Patient states may get 3 hours of sleep per night -This is an ongoing issue since 1989.  Patient states he is up thinking about would have someone breaks into his home. -Patient states he purchased OTC sleep aid, but is afraid to take it -Patient states [facility] [place] [facility] [place] [facility] but has been unable to 2/2 transportation issues.  Tobacco abuse: -Patient smoking 1 pack/day x 1-1/2 years -Patient thinking about quitting.  Allergies: NKDA  Past surgical history: None  Social history: Patient currently works at [employer].  He is working third shift since he cannot sleep at night.  Patient hopes to retire in the next 3-4 months.  Patient endorses tobacco use.  Patient also endorses alcohol use.  Family medical history: Mom-alive, DM, HLD, HTN Dad-deceased Sister-Lisa, deceased MontserratSister-Sabrina, alive Brother-Terry, alive, alcohol abuse, drug abuse, mental illness    Past Medical History:  Diagnosis Date  . Gout     History reviewed. No pertinent surgical history.  Current Outpatient Medications on File Prior to Visit  Medication Sig Dispense Refill  . colchicine 0.6 MG tablet Take 1 tablet (0.6 mg total) by mouth as needed. 2 tablets PO once, followed by 1 tablet 1 hour later.  Max 1.8mg  over 1 hour 6 tablet 0   No current facility-administered medications on file prior to visit.     No Known Allergies  History reviewed. No pertinent family history.  Social History     Socioeconomic History  . Marital status: Single    Spouse name: Not on file  . Number of children: Not on file  . Years of education: Not on file  . Highest education level: Not on file  Social Needs  . Financial resource strain: Not on file  . Food insecurity - worry: Not on file  . Food insecurity - inability: Not on file  . Transportation needs - medical: Not on file  . Transportation needs - non-medical: Not on file  Occupational History  . Not on file  Tobacco Use  . Smoking status: Current Every Day Smoker    Packs/day: 0.50  . Smokeless tobacco: Never Used  Substance and Sexual Activity  . Alcohol use: Yes    Alcohol/week: 4.2 oz    Types: 7 Cans of beer per week  . Drug use: No  . Sexual activity: Not on file  Other Topics Concern  . Not on file  Social History Narrative  . Not on file    ROS General: Denies fever, chills, night sweats, changes in weight, changes in appetite   +insomnia HEENT: Denies headaches, ear pain, changes in vision, rhinorrhea, sore throat CV: Denies CP, palpitations, SOB, orthopnea Pulm: Denies SOB, cough, wheezing GI: Denies abdominal pain, nausea, vomiting, diarrhea, constipation GU: Denies dysuria, hematuria, frequency, vaginal discharge Msk: Denies muscle cramps, joint pains Neuro: Denies weakness, numbness, tingling Skin: Denies rashes, bruising Psych: Denies depression, anxiety, hallucinations  BP 110/80 (BP Location: Right Arm,  Patient Position: Sitting, Cuff Size: Large)   Pulse 86   Temp 98.6 F (37 C) (Oral)   Ht 5' 7.25" (1.708 m)   Wt 194 lb 14.4 oz (88.4 kg)   BMI 30.30 kg/m   Physical Exam Gen. Pleasant, well developed, well-nourished, in NAD HEENT - [place]/AT, PERRL, no scleral icterus, no nasal drainage, pharynx without erythema or exudate. Lungs: no use of accessory muscles, CTAB, no wheezes, rales or rhonchi Cardiovascular: RRR, No r/g/m, no peripheral edema Abdomen: BS present, soft, nontender,nondistended, no  hepatosplenomegaly Musculoskeletal: No deformities, moves all four extremities, no cyanosis or clubbing, normal tone.  Wearing brace on L wrist. Neuro:  A&Ox3, CN II-XII intact, normal gait Skin:  Warm, dry, intact, no lesions Psych: normal affect, mood appropriate  No results found for this or any previous visit (from the past 2160 hour(s)).  Assessment/Plan: Chronic gout of left wrist, unspecified cause -Discussed foods to avoid -Patient advised to increase p.o. intake of water - Plan: allopurinol (ZYLOPRIM) 300 MG tablet  Erectile dysfunction, unspecified erectile dysfunction type  - Plan: sildenafil (VIAGRA) 100 MG tablet  Tobacco abuse -Smoking cessation counseling greater than 3 minutes, less than 10 minutes -Currently smoking 1 pack/day times last 1.5 years -Patient contemplative -Discussed decreasing number of cigarettes smoked by 1/day.  Patient seems amenable to this idea -Given handout on quitting smoking -We will readdress at each visit.  Insomnia -[facility]   -Discussed counseling options. -Pt to try OTC sleep aid.  Patient will try 1/2 pill.  Follow-up in the next few months for CPE.

## 2017-12-02 ENCOUNTER — Other Ambulatory Visit: Payer: Self-pay | Admitting: Family Medicine

## 2017-12-02 DIAGNOSIS — N529 Male erectile dysfunction, unspecified: Secondary | ICD-10-CM

## 2017-12-02 MED ORDER — SILDENAFIL CITRATE 100 MG PO TABS: ORAL_TABLET | ORAL | 0 refills | Status: DC

## 2017-12-02 NOTE — Telephone Encounter (Signed)
Copied from CRM [id number]. Topic: Quick Communication - Rx Refill/Question >> Dec 02, 2017 12:57 PM Mcneil, Ja-Kwan wrote: Medication: sildenafil (VIAGRA) 100 MG tablet  Has the patient contacted their pharmacy? yes   Preferred Pharmacy (with phone number or street name): [facility] [phone] - [place], [place] - [id number] [facility] [address] [phone]) [phone] (Fax)  Agent: Please be advised that RX refills may take up to 3 business days. We ask that you follow-up with your pharmacy.

## 2017-12-02 NOTE — Telephone Encounter (Signed)
Please Advise if ok to refill 

## 2017-12-02 NOTE — Telephone Encounter (Signed)
Last office visit 12/02/16; no upcoming visits noted; refill note states office visit needed for refills; contacted pt and made him aware; he schedules appointment with Dr Abbe AmsterdamShannon Banks, [facility], 12/07/17 at 1430; the pt can be contacted at [phone]; [facility] [place] [facility] [address] [place], [place]; will route to office for final disposition.

## 2017-12-07 ENCOUNTER — Encounter: Payer: Self-pay | Admitting: Family Medicine

## 2017-12-07 ENCOUNTER — Ambulatory Visit: Payer: Federal, State, Local not specified - PPO | Admitting: Family Medicine

## 2017-12-07 VITALS — BP 122/88 | HR 90 | Temp 98.8°F | Wt 192.0 lb

## 2017-12-07 DIAGNOSIS — Q600 Renal agenesis, unilateral: Secondary | ICD-10-CM

## 2017-12-07 DIAGNOSIS — Z8739 Personal history of other diseases of the musculoskeletal system and connective tissue: Secondary | ICD-10-CM | POA: Diagnosis not present

## 2017-12-07 MED ORDER — ALLOPURINOL 300 MG PO TABS: 300.0000 mg | ORAL_TABLET | Freq: Every day | ORAL | 3 refills | Status: DC

## 2017-12-07 NOTE — Patient Instructions (Signed)
Gout Gout is painful swelling that can happen in some of your joints. Gout is a type of arthritis. This condition is caused by having too much uric acid in your body. Uric acid is a chemical that is made when your body breaks down substances called purines. If your body has too much uric acid, sharp crystals can form and build up in your joints. This causes pain and swelling. Gout attacks can happen quickly and be very painful (acute gout). Over time, the attacks can affect more joints and happen more often (chronic gout). Follow these instructions at home: During a Gout Attack  If directed, put ice on the painful area: ? Put ice in a plastic bag. ? Place a towel between your skin and the bag. ? Leave the ice on for 20 minutes, 2-3 times a day.  Rest the joint as much as possible. If the joint is in your leg, you may be given crutches to use.  Raise (elevate) the painful joint above the level of your heart as often as you can.  Drink enough fluids to keep your pee (urine) clear or pale yellow.  Take over-the-counter and prescription medicines only as told by your doctor.  Do not drive or use heavy machinery while taking prescription pain medicine.  Follow instructions from your doctor about what you can or cannot eat and drink.  Return to your normal activities as told by your doctor. Ask your doctor what activities are safe for you. Avoiding Future Gout Attacks  Follow a low-purine diet as told by a specialist (dietitian) or your doctor. Avoid foods and drinks that have a lot of purines, such as: ? Liver. ? Kidney. ? Anchovies. ? Asparagus. ? Herring. ? Mushrooms ? Mussels. ? Beer.  Limit alcohol intake to no more than 1 drink a day for nonpregnant women and 2 drinks a day for men. One drink equals 12 oz of beer, 5 oz of wine, or 1 oz of hard liquor.  Stay at a healthy weight or lose weight if you are overweight. If you want to lose weight, talk with your doctor. It is  important that you do not lose weight too fast.  Start or continue an exercise plan as told by your doctor.  Drink enough fluids to keep your pee clear or pale yellow.  Take over-the-counter and prescription medicines only as told by your doctor.  Keep all follow-up visits as told by your doctor. This is important. Contact a doctor if:  You have another gout attack.  You still have symptoms of a gout attack after10 days of treatment.  You have problems (side effects) because of your medicines.  You have chills or a fever.  You have burning pain when you pee (urinate).  You have pain in your lower back or belly. Get help right away if:  You have very bad pain.  Your pain cannot be controlled.  You cannot pee. This information is not intended to replace advice given to you by your health care provider. Make sure you discuss any questions you have with your health care provider. Document Released: 10/02/2007 Document Revised: 05/31/2015 Document Reviewed: 10/05/2014 Elsevier Interactive Patient Education  2018 [employer] Low-Purine Diet Purines are compounds that affect the level of uric acid in your body. A low-purine diet is a diet that is low in purines. Eating a low-purine diet can prevent the level of uric acid in your body from getting too high and causing gout or kidney stones   or both. What do I need to know about this diet?  Choose low-purine foods. Examples of low-purine foods are listed in the next section.  Drink plenty of fluids, especially water. Fluids can help remove uric acid from your body. Try to drink 8-16 cups (1.9-3.8 L) a day.  Limit foods high in fat, especially saturated fat, as fat makes it harder for the body to get rid of uric acid. Foods high in saturated fat include pizza, cheese, ice cream, whole milk, fried foods, and gravies. Choose foods that are lower in fat and lean sources of protein. Use olive oil when cooking as it contains healthy fats  that are not high in saturated fat.  Limit alcohol. Alcohol interferes with the elimination of uric acid from your body. If you are having a gout attack, avoid all alcohol.  Keep in mind that different people's bodies react differently to different foods. You will probably learn over time which foods do or do not affect you. If you discover that a food tends to cause your gout to flare up, avoid eating that food. You can more freely enjoy foods that do not cause problems. If you have any questions about a food item, talk to your dietitian or health care provider. Which foods are low, moderate, and high in purines? The following is a list of foods that are low, moderate, and high in purines. You can eat any amount of the foods that are low in purines. You may be able to have small amounts of foods that are moderate in purines. Ask your health care provider how much of a food moderate in purines you can have. Avoid foods high in purines. Grains  Foods low in purines: Enriched white bread, pasta, rice, cake, cornbread, popcorn.  Foods moderate in purines: Whole-grain breads and cereals, wheat germ, bran, oatmeal. Uncooked oatmeal. Dry wheat bran or wheat germ.  Foods high in purines: Pancakes, French toast, biscuits, muffins. Vegetables  Foods low in purines: All vegetables, except those that are moderate in purines.  Foods moderate in purines: Asparagus, cauliflower, spinach, mushrooms, green peas. Fruits  All fruits are low in purines. Meats and other Protein Foods  Foods low in purines: Eggs, nuts, peanut butter.  Foods moderate in purines: 80-90% lean beef, lamb, veal, pork, poultry, fish, eggs, peanut butter, nuts. Crab, lobster, oysters, and shrimp. Cooked dried beans, peas, and lentils.  Foods high in purines: Anchovies, sardines, herring, mussels, tuna, codfish, scallops, trout, and haddock. Bacon. Organ meats (such as liver or kidney). Tripe. Game meat. Goose.  Sweetbreads. Dairy  All dairy foods are low in purines. Low-fat and fat-free dairy products are best because they are low in saturated fat. Beverages  Drinks low in purines: Water, carbonated beverages, tea, coffee, cocoa.  Drinks moderate in purines: Soft drinks and other drinks sweetened with high-fructose corn syrup. Juices. To find whether a food or drink is sweetened with high-fructose corn syrup, look at the ingredients list.  Drinks high in purines: Alcoholic beverages (such as beer). Condiments  Foods low in purines: Salt, herbs, olives, pickles, relishes, vinegar.  Foods moderate in purines: Butter, margarine, oils, mayonnaise. Fats and Oils  Foods low in purines: All types, except gravies and sauces made with meat.  Foods high in purines: Gravies and sauces made with meat. Other Foods  Foods low in purines: Sugars, sweets, gelatin. Cake. Soups made without meat.  Foods moderate in purines: Meat-based or fish-based soups, broths, or bouillons. Foods and drinks sweetened with high-fructose   corn syrup.  Foods high in purines: High-fat desserts (such as ice cream, cookies, cakes, pies, doughnuts, and chocolate). Contact your dietitian for more information on foods that are not listed here. This information is not intended to replace advice given to you by your health care provider. Make sure you discuss any questions you have with your health care provider. Document Released: 04/19/2010 Document Revised: 05/31/2015 Document Reviewed: 11/29/2012 Elsevier Interactive Patient Education  2017 Elsevier Inc.  

## 2017-12-07 NOTE — Progress Notes (Signed)
Subjective:    Patient ID: Kyle Mcbride, male    DOB: 08/12/1960, 57 y.o.   MRN: [id number]  No chief complaint on file.   HPI Patient was seen today for med refill.  Pt states he needs a refill on Allopurinol.  Pt has not had a gout flair in "a while".  Pt states he is drinking more water.  Pt has not had a CPE in several yrs as he does not like needles.  Pt mentions he has one kidney.  States was told this when in the [employer].  States his R never developed and the L is enlarged.  Past Medical History:  Diagnosis Date  . Gout     No Known Allergies  ROS General: Denies fever, chills, night sweats, changes in weight, changes in appetite HEENT: Denies headaches, ear pain, changes in vision, rhinorrhea, sore throat CV: Denies CP, palpitations, SOB, orthopnea Pulm: Denies SOB, cough, wheezing GI: Denies abdominal pain, nausea, vomiting, diarrhea, constipation GU: Denies dysuria, hematuria, frequency, vaginal discharge Msk: Denies muscle cramps, joint pains Neuro: Denies weakness, numbness, tingling Skin: Denies rashes, bruising Psych: Denies depression, anxiety, hallucinations     Objective:    Blood pressure 122/88, pulse 90, temperature 98.8 F (37.1 C), temperature source Oral, weight 192 lb (87.1 kg), SpO2 97 %.  Gen. Pleasant, well-nourished, in no distress, normal affect   HEENT: [place]/AT, face symmetric, no scleral icterus, PERRLA, nares patent without drainage  Lungs: no accessory muscle use, CTAB, no wheezes or rales Cardiovascular: RRR, no m/r/g, no peripheral edema Neuro:  A&Ox3, CN II-XII intact, normal gait  Wt Readings from Last 3 Encounters:  12/02/16 194 lb 14.4 oz (88.4 kg)  01/24/16 191 lb 9.6 oz (86.9 kg)  04/18/13 192 lb (87.1 kg)    Lab Results  Component Value Date   WBC 9.2 03/02/2012   HGB 15.4 03/02/2012   HCT 46.3 03/02/2012   PLT 206.0 03/02/2012   GLUCOSE 85 03/02/2012   ALT 30 03/02/2012   AST 23 03/02/2012   NA 139 03/02/2012   K 4.1  03/02/2012   CL 105 03/02/2012   CREATININE 1.3 03/02/2012   BUN 18 03/02/2012   CO2 29 03/02/2012   TSH 0.32 (L) 03/02/2012    Assessment/Plan:  History of gout  - Plan: allopurinol (ZYLOPRIM) 300 MG tablet  Congenital single kidney -no mention in chart. -discussed the importance of drinking plenty of water. -will need BMP.  -will need imaging and f/u with nephrology  F/u in no more than 4 wks for CPE including labs.  Abbe AmsterdamShannon Erinn Huskins, MD

## 2018-01-12 ENCOUNTER — Ambulatory Visit: Payer: Federal, State, Local not specified - PPO | Admitting: Family Medicine

## 2018-01-12 ENCOUNTER — Encounter: Payer: Self-pay | Admitting: Family Medicine

## 2018-01-12 VITALS — BP 124/90 | HR 84 | Temp 98.6°F | Wt 190.0 lb

## 2018-01-12 DIAGNOSIS — Z13220 Encounter for screening for lipoid disorders: Secondary | ICD-10-CM

## 2018-01-12 DIAGNOSIS — Z1159 Encounter for screening for other viral diseases: Secondary | ICD-10-CM | POA: Diagnosis not present

## 2018-01-12 DIAGNOSIS — Z131 Encounter for screening for diabetes mellitus: Secondary | ICD-10-CM | POA: Diagnosis not present

## 2018-01-12 DIAGNOSIS — Q600 Renal agenesis, unilateral: Secondary | ICD-10-CM | POA: Diagnosis not present

## 2018-01-12 DIAGNOSIS — Z0000 Encounter for general adult medical examination without abnormal findings: Secondary | ICD-10-CM | POA: Diagnosis not present

## 2018-01-12 DIAGNOSIS — Z1329 Encounter for screening for other suspected endocrine disorder: Secondary | ICD-10-CM

## 2018-01-12 DIAGNOSIS — Z125 Encounter for screening for malignant neoplasm of prostate: Secondary | ICD-10-CM

## 2018-01-12 DIAGNOSIS — Z8739 Personal history of other diseases of the musculoskeletal system and connective tissue: Secondary | ICD-10-CM

## 2018-01-12 DIAGNOSIS — Z1211 Encounter for screening for malignant neoplasm of colon: Secondary | ICD-10-CM

## 2018-01-12 LAB — COMPREHENSIVE METABOLIC PANEL
ALBUMIN: 4.2 g/dL (ref 3.5–5.2)
ALK PHOS: 56 U/L (ref 39–117)
ALT: 22 U/L (ref 0–53)
AST: 23 U/L (ref 0–37)
BUN: 11 mg/dL (ref 6–23)
CHLORIDE: 104 meq/L (ref 96–112)
CO2: 23 mEq/L (ref 19–32)
Calcium: 9.2 mg/dL (ref 8.4–10.5)
Creatinine, Ser: 1.22 mg/dL (ref 0.40–1.50)
GFR: 78.81 mL/min (ref >60.00)
Glucose, Bld: 108 mg/dL — ABNORMAL HIGH (ref 70–99)
POTASSIUM: 3.9 meq/L (ref 3.5–5.1)
Sodium: 137 mEq/L (ref 135–145)
TOTAL PROTEIN: 6.8 g/dL (ref 6.0–8.3)
Total Bilirubin: 0.4 mg/dL (ref 0.2–1.2)

## 2018-01-12 LAB — CBC WITH DIFFERENTIAL/PLATELET
BASOS PCT: 0.6 % (ref 0.0–3.0)
Basophils Absolute: 0 10*3/uL (ref 0.0–0.1)
EOS ABS: 0.1 10*3/uL (ref 0.0–0.7)
Eosinophils Relative: 1.5 % (ref 0.0–5.0)
HEMATOCRIT: 46.9 % (ref 39.0–52.0)
HEMOGLOBIN: 15.6 g/dL (ref 13.0–17.0)
Lymphocytes Relative: 28.4 % (ref 12.0–46.0)
Lymphs Abs: 1.6 10*3/uL (ref 0.7–4.0)
MCHC: 33.4 g/dL (ref 30.0–36.0)
MCV: 89.4 fl (ref 78.0–100.0)
MONOS PCT: 7.5 % (ref 3.0–12.0)
Monocytes Absolute: 0.4 10*3/uL (ref 0.1–1.0)
NEUTROS ABS: 3.6 10*3/uL (ref 1.4–7.7)
Neutrophils Relative %: 62 % (ref 43.0–77.0)
Platelets: 239 10*3/uL (ref 150.0–400.0)
RBC: 5.25 Mil/uL (ref 4.22–5.81)
RDW: 14 % (ref 11.5–15.5)
WBC: 5.8 10*3/uL (ref 4.0–10.5)

## 2018-01-12 LAB — T4, FREE: FREE T4: 0.79 ng/dL (ref 0.60–1.60)

## 2018-01-12 LAB — TSH: TSH: 1.19 u[IU]/mL (ref 0.35–4.50)

## 2018-01-12 LAB — LIPID PANEL
Cholesterol: 221 mg/dL — ABNORMAL HIGH (ref 0–200)
HDL: 49 mg/dL (ref >39.00)
NONHDL: 171.87
TRIGLYCERIDES: 259 mg/dL — AB (ref 0.0–149.0)
Total CHOL/HDL Ratio: 5
VLDL: 51.8 mg/dL — AB (ref 0.0–40.0)

## 2018-01-12 LAB — HEMOGLOBIN A1C: HEMOGLOBIN A1C: 5.8 % (ref 4.6–6.5)

## 2018-01-12 LAB — LDL CHOLESTEROL, DIRECT: Direct LDL: 132 mg/dL

## 2018-01-12 NOTE — Progress Notes (Signed)
Subjective:     Kyle Mcbride is a 58 y.o. male and is here for a comprehensive physical exam. The patient reports no problems. Pt has been drinking more water, not drinking beer and not eating red meat.  Pt notes an improvement in his gout since the changes, denies L wrist pain.  States has been feeling good.   Pt hesitant to get labs but does not like needles.  Pthas a history of single kidney.  Advised 30 + yrs ago while in the [employer], his right kidney did not properly/not functional.  Pt mentions family hx of single kidney in mother and a few other family members.  Social History   Socioeconomic History  . Marital status: Single    Spouse name: Not on file  . Number of children: Not on file  . Years of education: Not on file  . Highest education level: Not on file  Occupational History  . Not on file  Social Needs  . Financial resource strain: Not on file  . Food insecurity:    Worry: Not on file    Inability: Not on file  . Transportation needs:    Medical: Not on file    Non-medical: Not on file  Tobacco Use  . Smoking status: Current Every Day Smoker    Packs/day: 0.50  . Smokeless tobacco: Never Used  Substance and Sexual Activity  . Alcohol use: Yes    Alcohol/week: 7.0 standard drinks    Types: 7 Cans of beer per week  . Drug use: No  . Sexual activity: Not on file  Lifestyle  . Physical activity:    Days per week: Not on file    Minutes per session: Not on file  . Stress: Not on file  Relationships  . Social connections:    Talks on phone: Not on file    Gets together: Not on file    Attends religious service: Not on file    Active member of club or organization: Not on file    Attends meetings of clubs or organizations: Not on file    Relationship status: Not on file  . Intimate partner violence:    Fear of current or ex partner: Not on file    Emotionally abused: Not on file    Physically abused: Not on file    Forced sexual activity: Not on file  Other  Topics Concern  . Not on file  Social History Narrative  . Not on file   Health Maintenance  Topic Date Due  . Hepatitis C Screening  [birth year]  . HIV Screening  04/11/1976  . TETANUS/TDAP  01/06/1994  . COLONOSCOPY  04/12/2011  . INFLUENZA VACCINE  08/06/2017    The following portions of the patient's history were reviewed and updated as appropriate: allergies, current medications, past family history, past medical history, past social history, past surgical history and problem list.  Review of Systems Pertinent items noted in HPI and remainder of comprehensive ROS otherwise negative.   Objective:    BP 124/90   Pulse 84   Temp 98.6 F (37 C) (Oral)   Wt 190 lb (86.2 kg)   SpO2 96%   BMI 29.54 kg/m  General appearance: alert, cooperative and no distress Head: Normocephalic, without obvious abnormality, atraumatic Eyes: conjunctivae/corneas clear. PERRL, EOM's intact. Fundi benign. Ears: normal TM's and external ear canals both ears Nose: Nares normal. Septum midline. Mucosa normal. No drainage or sinus tenderness. Throat: lips, mucosa, and tongue normal; teeth  and gums normal Neck: no adenopathy, no carotid bruit, no JVD, supple, symmetrical, trachea midline and thyroid not enlarged, symmetric, no tenderness/mass/nodules Lungs: clear to auscultation bilaterally Heart: regular rate and rhythm, S1, S2 normal, no murmur, click, rub or gallop Abdomen: soft, non-tender; bowel sounds normal; no masses,  no organomegaly Extremities: extremities normal, atraumatic, no cyanosis or edema Skin: Skin color, texture, turgor normal. No rashes or lesions Neurologic: Alert and oriented X 3, normal strength and tone. Normal symmetric reflexes. Normal coordination and gait    Assessment:    Healthy male exam.      Plan:     Anticipatory guidance given including wearing seatbelts, smoke detectors in the home, increasing physical activity, increasing p.o. intake of water and  vegetables. -will obtain labs (lipids, hemoglobin A1c, hep C, PSA, TSH and free T4, CBC, CMP) -discussed preventing screening including colonoscopy--order placed -discussed immunizations including influenza.  Pt declines at this time. See After Visit Summary for Counseling Recommendations    Single Kidney? -per chart review no imaging to confirm -discussed need for imaging/referral to Nephrology, pt wishes to wait at this time -will obtain CMP this visit  F/u in the next few months.  Abbe AmsterdamShannon Ekam Bonebrake, MD

## 2018-01-12 NOTE — Patient Instructions (Signed)
Preventive Care 40-64 Years, Male Preventive care refers to lifestyle choices and visits with your health care provider that can promote health and wellness. What does preventive care include?   A yearly physical exam. This is also called an annual well check.  Dental exams once or twice a year.  Routine eye exams. Ask your health care provider how often you should have your eyes checked.  Personal lifestyle choices, including: ? Daily care of your teeth and gums. ? Regular physical activity. ? Eating a healthy diet. ? Avoiding tobacco and drug use. ? Limiting alcohol use. ? Practicing safe sex. ? Taking low-dose aspirin every day starting at age 50. What happens during an annual well check? The services and screenings done by your health care provider during your annual well check will depend on your age, overall health, lifestyle risk factors, and family history of disease. Counseling Your health care provider may ask you questions about your:  Alcohol use.  Tobacco use.  Drug use.  Emotional well-being.  Home and relationship well-being.  Sexual activity.  Eating habits.  Work and work environment. Screening You may have the following tests or measurements:  Height, weight, and BMI.  Blood pressure.  Lipid and cholesterol levels. These may be checked every 5 years, or more frequently if you are over 50 years old.  Skin check.  Lung cancer screening. You may have this screening every year starting at age 55 if you have a 30-pack-year history of smoking and currently smoke or have quit within the past 15 years.  Colorectal cancer screening. All adults should have this screening starting at age 50 and continuing until age 75. Your health care provider may recommend screening at age 45. You will have tests every 1-10 years, depending on your results and the type of screening test. People at increased risk should start screening at an earlier age. Screening tests may  include: ? Guaiac-based fecal occult blood testing. ? Fecal immunochemical test (FIT). ? Stool DNA test. ? Virtual colonoscopy. ? Sigmoidoscopy. During this test, a flexible tube with a tiny camera (sigmoidoscope) is used to examine your rectum and lower colon. The sigmoidoscope is inserted through your anus into your rectum and lower colon. ? Colonoscopy. During this test, a long, thin, flexible tube with a tiny camera (colonoscope) is used to examine your entire colon and rectum.  Prostate cancer screening. Recommendations will vary depending on your family history and other risks.  Hepatitis C blood test.  Hepatitis B blood test.  Sexually transmitted disease (STD) testing.  Diabetes screening. This is done by checking your blood sugar (glucose) after you have not eaten for a while (fasting). You may have this done every 1-3 years. Discuss your test results, treatment options, and if necessary, the need for more tests with your health care provider. Vaccines Your health care provider may recommend certain vaccines, such as:  Influenza vaccine. This is recommended every year.  Tetanus, diphtheria, and acellular pertussis (Tdap, Td) vaccine. You may need a Td booster every 10 years.  Varicella vaccine. You may need this if you have not been vaccinated.  Zoster vaccine. You may need this after age 60.  Measles, mumps, and rubella (MMR) vaccine. You may need at least one dose of MMR if you were born in 1957 or later. You may also need a second dose.  Pneumococcal 13-valent conjugate (PCV13) vaccine. You may need this if you have certain conditions and have not been vaccinated.  Pneumococcal polysaccharide (PPSV23) vaccine.   You may need one or two doses if you smoke cigarettes or if you have certain conditions.  Meningococcal vaccine. You may need this if you have certain conditions.  Hepatitis A vaccine. You may need this if you have certain conditions or if you travel or work in  places where you may be exposed to hepatitis A.  Hepatitis B vaccine. You may need this if you have certain conditions or if you travel or work in places where you may be exposed to hepatitis B.  Haemophilus influenzae type b (Hib) vaccine. You may need this if you have certain risk factors. Talk to your health care provider about which screenings and vaccines you need and how often you need them. This information is not intended to replace advice given to you by your health care provider. Make sure you discuss any questions you have with your health care provider. Document Released: 01/19/2015 Document Revised: 02/12/2017 Document Reviewed: 10/24/2014 Elsevier Interactive Patient Education  2019 Elsevier Inc.  Preventing Hypertension Hypertension, commonly called high blood pressure, is when the force of blood pumping through the arteries is too strong. Arteries are blood vessels that carry blood from the heart throughout the body. Over time, hypertension can damage the arteries and decrease blood flow to important parts of the body, including the brain, heart, and kidneys. Often, hypertension does not cause symptoms until blood pressure is very high. For this reason, it is important to have your blood pressure checked on a regular basis. Hypertension can often be prevented with diet and lifestyle changes. If you already have hypertension, you can control it with diet and lifestyle changes, as well as medicine. What nutrition changes can be made? Maintain a healthy diet. This includes:  Eating less salt (sodium). Ask your health care provider how much sodium is safe for you to have. The general recommendation is to consume less than 1 tsp (2,300 mg) of sodium a day. ? Do not add salt to your food. ? Choose low-sodium options when grocery shopping and eating out.  Limiting fats in your diet. You can do this by eating low-fat or fat-free dairy products and by eating less red meat.  Eating more  fruits, vegetables, and whole grains. Make a goal to eat: ? 1-2 cups of fresh fruits and vegetables each day. ? 3-4 servings of whole grains each day.  Avoiding foods and beverages that have added sugars.  Eating fish that contain healthy fats (omega-3 fatty acids), such as mackerel or salmon. If you need help putting together a healthy eating plan, try the DASH diet. This diet is high in fruits, vegetables, and whole grains. It is low in sodium, red meat, and added sugars. DASH stands for Dietary Approaches to Stop Hypertension. What lifestyle changes can be made?   Lose weight if you are overweight. Losing just 3?5% of your body weight can help prevent or control hypertension. ? For example, if your present weight is 200 lb (91 kg), a loss of 3-5% of your weight means losing 6-10 lb (2.7-4.5 kg). ? Ask your health care provider to help you with a diet and exercise plan to safely lose weight.  Get enough exercise. Do at least 150 minutes of moderate-intensity exercise each week. ? You could do this in short exercise sessions several times a day, or you could do longer exercise sessions a few times a week. For example, you could take a brisk 10-minute walk or bike ride, 3 times a day, for 5 days a  week.  Find ways to reduce stress, such as exercising, meditating, listening to music, or taking a yoga class. If you need help reducing stress, ask your health care provider.  Do not smoke. This includes e-cigarettes. Chemicals in tobacco and nicotine products raise your blood pressure each time you smoke. If you need help quitting, ask your health care provider.  Avoid alcohol. If you drink alcohol, limit alcohol intake to no more than 1 drink a day for nonpregnant women and 2 drinks a day for men. One drink equals 12 oz of beer, 5 oz of wine, or 1 oz of hard liquor. Why are these changes important? Diet and lifestyle changes can help you prevent hypertension, and they may make you feel better  overall and improve your quality of life. If you have hypertension, making these changes will help you control it and help prevent major complications, such as:  Hardening and narrowing of arteries that supply blood to: ? Your heart. This can cause a heart attack. ? Your brain. This can cause a stroke. ? Your kidneys. This can cause kidney failure.  Stress on your heart muscle, which can cause heart failure. What can I do to lower my risk?  Work with your health care provider to make a hypertension prevention plan that works for you. Follow your plan and keep all follow-up visits as told by your health care provider.  Learn how to check your blood pressure at home. Make sure that you know your personal target blood pressure, as told by your health care provider. How is this treated? In addition to diet and lifestyle changes, your health care provider may recommend medicines to help lower your blood pressure. You may need to try a few different medicines to find what works best for you. You also may need to take more than one medicine. Take over-the-counter and prescription medicines only as told by your health care provider. Where to find support Your health care provider can help you prevent hypertension and help you keep your blood pressure at a healthy level. Your local hospital or your community may also provide support services and prevention programs. The American Heart Association offers an online support network at: CheapBootlegs.com.cy Where to find more information Learn more about hypertension from:  [place], Lung, and Blood Institute: ElectronicHangman.is  Centers for Disease Control and Prevention: https://ingram.com/  American Academy of Family Physicians: http://familydoctor.org/familydoctor/en/diseases-conditions/high-blood-pressure.printerview.all.html Learn more about the DASH diet from:  [place], Lung, and [place]: https://www.reyes.com/ Contact a health care provider if:  You think you are having a reaction to medicines you have taken.  You have recurrent headaches or feel dizzy.  You have swelling in your ankles.  You have trouble with your vision. Summary  Hypertension often does not cause any symptoms until blood pressure is very high. It is important to get your blood pressure checked regularly.  Diet and lifestyle changes are the most important steps in preventing hypertension.  By keeping your blood pressure in a healthy range, you can prevent complications like heart attack, heart failure, stroke, and kidney failure.  Work with your health care provider to make a hypertension prevention plan that works for you. This information is not intended to replace advice given to you by your health care provider. Make sure you discuss any questions you have with your health care provider. Document Released: 01/07/2015 Document Revised: 09/03/2015 Document Reviewed: 09/03/2015 Elsevier Interactive Patient Education  2019 [employer].

## 2018-01-13 LAB — HEPATITIS C ANTIBODY
Hepatitis C Ab: NONREACTIVE
SIGNAL TO CUT-OFF: 0.03 (ref <1.00)

## 2018-01-14 ENCOUNTER — Encounter: Payer: Self-pay | Admitting: Family Medicine

## 2018-02-15 ENCOUNTER — Ambulatory Visit: Payer: Federal, State, Local not specified - PPO | Admitting: Family Medicine

## 2018-02-15 DIAGNOSIS — Z0289 Encounter for other administrative examinations: Secondary | ICD-10-CM

## 2018-12-15 ENCOUNTER — Telehealth (INDEPENDENT_AMBULATORY_CARE_PROVIDER_SITE_OTHER): Payer: Federal, State, Local not specified - PPO | Admitting: Family Medicine

## 2018-12-15 DIAGNOSIS — N5089 Other specified disorders of the male genital organs: Secondary | ICD-10-CM

## 2018-12-15 DIAGNOSIS — R369 Urethral discharge, unspecified: Secondary | ICD-10-CM | POA: Diagnosis not present

## 2018-12-15 NOTE — Progress Notes (Signed)
Virtual Visit via Telephone Note  I connected with Kyle Mcbride on 12/15/18 at  3:30 PM EST by telephone and verified that I am speaking with the correct person using two identifiers.   I discussed the limitations, risks, security and privacy concerns of performing an evaluation and management service by telephone and the availability of in person appointments. I also discussed with the patient that there may be a patient responsible charge related to this service. The patient expressed understanding and agreed to proceed.  Location patient: home Location provider: work or home office Participants present for the call: patient, provider Patient did not have a visit in the prior 7 days to address this/these issue(s).   History of Present Illness: Pt is a 58 yo male with pmh sig for plantar fasciitis, congenital single kidney, nicotine use, ED and gout.  Pt states "I messed up".  Pt with milky white d/c from penis and a "tickling feeling" x 1 wk.  Also notes testicular edema x 1 wk, increased urination.  Denies abdominal pain, n/v, fever, chills.  Pt advised his partner to get tested, but she has not.  Pt no longer communicating with that male.   Observations/Objective: Patient sounds cheerful and well on the phone. I do not appreciate any SOB. Speech and thought processing are grossly intact. Patient reported vitals:  Assessment and Plan: Penile discharge  -possibly 2/2 STI, UTI, prostatitis -pt to come in to clinic for testing -advised to refrain from having sex until results available - Plan: C. trachomatis/N. gonorrhoeae RNA, RPR, HIV antibody (with reflex), POC Urinalysis Dipstick -Given precautions  Scrotal edema -possibly 2/2 STI, varicocele, epididymitis -We will obtain testing -pt given precautions  Follow Up Instructions: Pt to f/u in clinic tomorrow for labs  I did not refer this patient for an OV in the next 24 hours for this/these issue(s).  I discussed the  assessment and treatment plan with the patient. The patient was provided an opportunity to ask questions and all were answered. The patient agreed with the plan and demonstrated an understanding of the instructions.   The patient was advised to call back or seek an in-person evaluation if the symptoms worsen or if the condition fails to improve as anticipated.  I provided 7 minutes of non-face-to-face time during this encounter.   Billie Ruddy, MD

## 2018-12-16 ENCOUNTER — Other Ambulatory Visit: Payer: Federal, State, Local not specified - PPO

## 2018-12-16 ENCOUNTER — Other Ambulatory Visit: Payer: Self-pay

## 2018-12-16 ENCOUNTER — Other Ambulatory Visit (INDEPENDENT_AMBULATORY_CARE_PROVIDER_SITE_OTHER): Payer: Federal, State, Local not specified - PPO

## 2018-12-16 DIAGNOSIS — R369 Urethral discharge, unspecified: Secondary | ICD-10-CM

## 2018-12-16 LAB — POCT URINALYSIS DIPSTICK
Glucose, UA: NEGATIVE
Protein, UA: POSITIVE — AB
Spec Grav, UA: >=1.03 — AB (ref 1.010–1.025)
Urobilinogen, UA: 0.2 E.U./dL
pH, UA: 6 (ref 5.0–8.0)

## 2018-12-17 ENCOUNTER — Ambulatory Visit (INDEPENDENT_AMBULATORY_CARE_PROVIDER_SITE_OTHER): Payer: Federal, State, Local not specified - PPO

## 2018-12-17 DIAGNOSIS — A549 Gonococcal infection, unspecified: Secondary | ICD-10-CM

## 2018-12-17 LAB — C. TRACHOMATIS/N. GONORRHOEAE RNA
C. trachomatis RNA, TMA: NOT DETECTED
N. gonorrhoeae RNA, TMA: DETECTED — AB

## 2018-12-17 LAB — HIV ANTIBODY (ROUTINE TESTING W REFLEX): HIV 1&2 Ab, 4th Generation: NONREACTIVE

## 2018-12-17 LAB — RPR: RPR Ser Ql: NONREACTIVE

## 2018-12-17 MED ORDER — CEFTRIAXONE SODIUM 500 MG IJ SOLR
250.0000 mg | Freq: Once | INTRAMUSCULAR | Status: AC
  Administered 2018-12-17: 16:00:00 250 mg via INTRAMUSCULAR

## 2018-12-17 NOTE — Progress Notes (Signed)
Per orders of Dr Volanda Napoleon, injection of Rocephin 250 mg given by Wyvonne Lenz. Patient tolerated injection well.

## 2018-12-18 LAB — URINE CULTURE
MICRO NUMBER:: 1185074
Result:: NO GROWTH
SPECIMEN QUALITY:: ADEQUATE

## 2019-10-26 ENCOUNTER — Other Ambulatory Visit: Payer: Self-pay | Admitting: Family Medicine

## 2019-10-26 DIAGNOSIS — N529 Male erectile dysfunction, unspecified: Secondary | ICD-10-CM

## 2019-10-26 NOTE — Telephone Encounter (Signed)
Left a message for pt to call and schedule a visit for medication refill

## 2019-11-07 ENCOUNTER — Other Ambulatory Visit: Payer: Self-pay | Admitting: Family Medicine

## 2019-11-07 DIAGNOSIS — N529 Male erectile dysfunction, unspecified: Secondary | ICD-10-CM

## 2019-11-14 ENCOUNTER — Emergency Department (HOSPITAL_COMMUNITY)
Admission: EM | Admit: 2019-11-14 | Discharge: 2019-11-14 | Disposition: A | Discharge status: Home / Self Care | Payer: Federal, State, Local not specified - PPO | Attending: Emergency Medicine | Admitting: Emergency Medicine

## 2019-11-14 ENCOUNTER — Other Ambulatory Visit: Payer: Self-pay

## 2019-11-14 DIAGNOSIS — F17200 Nicotine dependence, unspecified, uncomplicated: Secondary | ICD-10-CM | POA: Insufficient documentation

## 2019-11-14 DIAGNOSIS — R369 Urethral discharge, unspecified: Secondary | ICD-10-CM | POA: Diagnosis not present

## 2019-11-14 DIAGNOSIS — Z711 Person with feared health complaint in whom no diagnosis is made: Secondary | ICD-10-CM

## 2019-11-14 DIAGNOSIS — Z79899 Other long term (current) drug therapy: Secondary | ICD-10-CM | POA: Diagnosis not present

## 2019-11-14 DIAGNOSIS — Z202 Contact with and (suspected) exposure to infections with a predominantly sexual mode of transmission: Secondary | ICD-10-CM | POA: Diagnosis not present

## 2019-11-14 LAB — URINALYSIS, ROUTINE W REFLEX MICROSCOPIC
Bacteria, UA: NONE SEEN
Bilirubin Urine: NEGATIVE
Glucose, UA: NEGATIVE mg/dL
Hgb urine dipstick: NEGATIVE
Ketones, ur: NEGATIVE mg/dL
Nitrite: NEGATIVE
Protein, ur: NEGATIVE mg/dL
Specific Gravity, Urine: 1.02 (ref 1.005–1.030)
WBC, UA: >50 WBC/hpf — ABNORMAL HIGH (ref 0–5)
pH: 5 (ref 5.0–8.0)

## 2019-11-14 MED ORDER — CEFTRIAXONE SODIUM 500 MG IJ SOLR
500.0000 mg | Freq: Once | INTRAMUSCULAR | Status: AC
  Administered 2019-11-14: 500 mg via INTRAMUSCULAR
  Filled 2019-11-14: qty 500

## 2019-11-14 MED ORDER — LIDOCAINE HCL (PF) 1 % IJ SOLN
INTRAMUSCULAR | Status: AC
  Administered 2019-11-14: 1 mL
  Filled 2019-11-14: qty 5

## 2019-11-14 MED ORDER — AZITHROMYCIN 250 MG PO TABS
1000.0000 mg | ORAL_TABLET | Freq: Once | ORAL | Status: AC
  Administered 2019-11-14: 1000 mg via ORAL
  Filled 2019-11-14: qty 4

## 2019-11-14 NOTE — Discharge Instructions (Signed)
You will be called if STD tests are positive. These take approximately 48 hours to return. If positive let your sexual partner know so they may be tested and treated.  Refrain from sexual intercourse for 7 days.  Return for new or worsening symptoms

## 2019-11-14 NOTE — ED Notes (Signed)
Reviewed discharge instructions with patient. Follow-up care reviewed. Patient verbalized understanding. Patient A&Ox4, VSS, and ambulatory with steady gait upon discharge.  

## 2019-11-14 NOTE — ED Triage Notes (Signed)
"  I messed up, I got VD". Stated he has a milky white discharge coming out,

## 2019-11-14 NOTE — ED Provider Notes (Signed)
[facility] EMERGENCY DEPARTMENT Provider Note   CSN: [id number] Arrival date & time: 11/14/19  1356    History Chief Complaint  Patient presents with  . Penile Discharge    Kyle Mcbride is a 59 y.o. male with no significant past medical history who presents for evaluation of penile discharge. Sexually active.  Does not use protection. White discharge to distal penis. No hematuria. No pain with bowel movements, testicular pain, scrotal redness, swelling , warmth. No adb pain, fever, chills, N/V, weakness.  Does have history of STDs.  Has not been tested for HIV or syphilis.  Does not take anything for symptoms.  Denies additional aggravating or alleviating factors.  History obtained from patient and past medical history. No interpretor was used.  HPI     Past Medical History:  Diagnosis Date  . Gout     Patient Active Problem List   Diagnosis Date Noted  . Congenital single kidney 12/07/2017  . History of gout 12/07/2017  . Plantar fasciitis of left foot 04/18/2013  . Frequent nosebleeds 03/23/2013  . Viral URI 06/07/2012  . Gout attack 01/03/2011  . TENDINITIS, LEFT WRIST 07/17/2009  . TOBACCO ABUSE 12/08/2008  . 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 ALLERGIC RHINITIS 03/28/2008    No past surgical history on file.     No family history on file.  Social History   Tobacco Use  . Smoking status: Current Every Day Smoker    Packs/day: 0.50  . Smokeless tobacco: Never Used  Substance Use Topics  . Alcohol use: Yes    Alcohol/week: 7.0 standard drinks    Types: 7 Cans of beer per week  . Drug use: No    Home Medications Prior to Admission medications   Medication Sig Start Date End Date Taking? Authorizing Provider  allopurinol (ZYLOPRIM) 300 MG tablet Take 1 tablet (300 mg total) by mouth daily. 12/07/17   Deeann Saint, MD  colchicine 0.6 MG tablet Take 1 tablet (0.6 mg total) by mouth as needed. 2 tablets PO once, followed by 1  tablet 1 hour later.  Max 1.8mg  over 1 hour 01/24/16   Chad, Emily, PA-C  sildenafil (VIAGRA) 100 MG tablet take 1/2-1 tablet by mouth once daily if needed for ERECTILE DYSFUNCTION 12/02/17   Deeann Saint, MD    Allergies    Patient has no known allergies.  Review of Systems   Review of Systems  Constitutional: Negative.   HENT: Negative.   Respiratory: Negative.   Cardiovascular: Negative.   Gastrointestinal: Negative.   Genitourinary: Positive for discharge. Negative for decreased urine volume, difficulty urinating, dysuria, flank pain, frequency, genital sores, hematuria, penile pain, penile swelling, scrotal swelling, testicular pain and urgency.  Musculoskeletal: Negative.   Skin: Negative.   Neurological: Negative.   All other systems reviewed and are negative.   Physical Exam Updated Vital Signs BP 122/85 (BP Location: Left Arm)   Pulse 88   Temp 98.9 F (37.2 C) (Oral)   Resp 18   SpO2 98%   Physical Exam Vitals and nursing note reviewed.  Constitutional:      General: He is not in acute distress.    Appearance: He is well-developed. He is not ill-appearing, toxic-appearing or diaphoretic.  HENT:     Head: Normocephalic and atraumatic.     Nose: Nose normal.     Mouth/Throat:     Mouth: Mucous membranes are moist.  Eyes:     Pupils: Pupils are equal, round,  and reactive to light.  Cardiovascular:     Rate and Rhythm: Normal rate and regular rhythm.     Pulses: Normal pulses.     Heart sounds: Normal heart sounds.  Pulmonary:     Effort: Pulmonary effort is normal. No respiratory distress.     Breath sounds: Normal breath sounds.  Abdominal:     General: Bowel sounds are normal. There is no distension.     Palpations: Abdomen is soft.  Genitourinary:    Comments: Declined GU exam Musculoskeletal:        General: Normal range of motion.     Cervical back: Normal range of motion and neck supple.  Skin:    General: Skin is warm and dry.     Capillary  Refill: Capillary refill takes less than 2 seconds.  Neurological:     General: No focal deficit present.     Mental Status: He is alert.     ED Results / Procedures / Treatments   Labs (all labs ordered are listed, but only abnormal results are displayed) Labs Reviewed  URINALYSIS, ROUTINE W REFLEX MICROSCOPIC  GC/CHLAMYDIA PROBE AMP ([facility]) NOT AT [facility]    EKG None  Radiology No results found.  Procedures Procedures (including critical care time)  Medications Ordered in ED Medications  cefTRIAXone (ROCEPHIN) injection 500 mg (500 mg Intramuscular Given 11/14/19 1635)  azithromycin (ZITHROMAX) tablet 1,000 mg (1,000 mg Oral Given 11/14/19 1634)  lidocaine (PF) (XYLOCAINE) 1 % injection (1 mL  Given 11/14/19 1634)    ED Course  I have reviewed the triage vital signs and the nursing notes.  Pertinent labs & imaging results that were available during my care of the patient were reviewed by me and considered in my medical decision making (see chart for details).  59 year old presents for evaluation of possible STD.  Noted with discharge earlier today.  No hematuria, dysuria.  Patient is afebrile without abdominal tenderness, abdominal pain or painful bowel movements to indicate prostatitis.  Declined GU exam however denies pain to palpation of the testes or epididymis to suggest orchitis or epididymitis.  STD cultures obtained including gonorrhea and chlamydia. Does not want blood draw for HIV, Syphillis. Patient to be discharged with instructions to follow up with PCP. Discussed importance of using protection when sexually active. Pt understands that they have GC/Chlamydia cultures pending and that they will need to inform all sexual partners if results return positive. Patient has been treated prophylactically with azithromycin and Rocephin.   The patient has been appropriately medically screened and/or stabilized in the ED. I have low suspicion for any other emergent medical  condition which would require further screening, evaluation or treatment in the ED or require inpatient management.  Patient is hemodynamically stable and in no acute distress.  Patient able to ambulate in department prior to ED.  Evaluation does not show acute pathology that would require ongoing or additional emergent interventions while in the emergency department or further inpatient treatment.  I have discussed the diagnosis with the patient and answered all questions.  Pain is been managed while in the emergency department and patient has no further complaints prior to discharge.  Patient is comfortable with plan discussed in room and is stable for discharge at this time.  I have discussed strict return precautions for returning to the emergency department.  Patient was encouraged to follow-up with PCP/specialist refer to at discharge.     MDM Rules/Calculators/A&P  Final Clinical Impression(s) / ED Diagnoses Final diagnoses:  Penile discharge  Concern about STD in male without diagnosis    Rx / DC Orders ED Discharge Orders    None       Levonte Molina A, PA-C 11/14/19 1637    Jacalyn Lefevre, MD 11/15/19 [phone]

## 2019-11-16 ENCOUNTER — Ambulatory Visit: Payer: Federal, State, Local not specified - PPO | Admitting: Family Medicine

## 2020-02-06 ENCOUNTER — Encounter (HOSPITAL_COMMUNITY): Payer: Self-pay

## 2020-02-06 ENCOUNTER — Other Ambulatory Visit: Payer: Self-pay

## 2020-02-06 DIAGNOSIS — K625 Hemorrhage of anus and rectum: Secondary | ICD-10-CM | POA: Diagnosis not present

## 2020-02-06 DIAGNOSIS — Z5321 Procedure and treatment not carried out due to patient leaving prior to being seen by health care provider: Secondary | ICD-10-CM | POA: Insufficient documentation

## 2020-02-06 NOTE — ED Triage Notes (Signed)
Pt reports faint blood in stool when wiping. Pt sts that he has had gout pain in right wrist and took 59 year old allopurinol and suspects that gave him diarrhea.

## 2020-02-07 ENCOUNTER — Emergency Department (HOSPITAL_COMMUNITY)
Admission: EM | Admit: 2020-02-07 | Discharge: 2020-02-07 | Disposition: A | Discharge status: Home / Self Care | Payer: Federal, State, Local not specified - PPO | Attending: Emergency Medicine | Admitting: Emergency Medicine

## 2020-02-07 NOTE — ED Notes (Signed)
Pt called for vitals no answer. °

## 2020-02-07 NOTE — ED Notes (Signed)
PT called no answer

## 2020-02-08 ENCOUNTER — Other Ambulatory Visit: Payer: Self-pay

## 2020-02-09 ENCOUNTER — Ambulatory Visit: Payer: Federal, State, Local not specified - PPO | Admitting: Family Medicine

## 2020-02-09 DIAGNOSIS — Z0289 Encounter for other administrative examinations: Secondary | ICD-10-CM

## 2020-08-21 ENCOUNTER — Telehealth: Payer: Self-pay | Admitting: Family Medicine

## 2020-08-21 NOTE — Telephone Encounter (Signed)
Patient came in office stating that he would like a walk in appointment with Dr. Salomon Fick. Informed patient that Dr. Salomon Fick does not take walk ins and offered him next available appointment. Patient says that his gout has flared up again and is wanting medication sent in to help for the flare up.  Patient is requesting a refill of allopurinol (ZYLOPRIM) 300 [phone] [place] [place] - [address] AT [facility] OF [address].  Please advise.

## 2020-08-24 ENCOUNTER — Telehealth: Payer: Self-pay | Admitting: Family Medicine

## 2020-08-24 NOTE — Telephone Encounter (Signed)
Spoke with pt, made appointment for 8/22.

## 2020-08-24 NOTE — Telephone Encounter (Signed)
Spoke with pt, made appointment for 8/22. 

## 2020-08-24 NOTE — Telephone Encounter (Addendum)
Patient is wanting some more medication for his goat in his wrist.  Patient is wanting a call back.  [facility] [address]

## 2020-08-27 ENCOUNTER — Other Ambulatory Visit: Payer: Self-pay

## 2020-08-27 ENCOUNTER — Ambulatory Visit: Payer: Federal, State, Local not specified - PPO | Admitting: Family Medicine

## 2020-08-27 ENCOUNTER — Encounter: Payer: Self-pay | Admitting: Family Medicine

## 2020-08-27 VITALS — BP 112/88 | HR 107 | Temp 98.9°F | Wt 162.2 lb

## 2020-08-27 DIAGNOSIS — Q600 Renal agenesis, unilateral: Secondary | ICD-10-CM | POA: Diagnosis not present

## 2020-08-27 DIAGNOSIS — Z8719 Personal history of other diseases of the digestive system: Secondary | ICD-10-CM

## 2020-08-27 DIAGNOSIS — F17200 Nicotine dependence, unspecified, uncomplicated: Secondary | ICD-10-CM

## 2020-08-27 DIAGNOSIS — Z8739 Personal history of other diseases of the musculoskeletal system and connective tissue: Secondary | ICD-10-CM

## 2020-08-27 DIAGNOSIS — Z1211 Encounter for screening for malignant neoplasm of colon: Secondary | ICD-10-CM | POA: Diagnosis not present

## 2020-08-27 DIAGNOSIS — M109 Gout, unspecified: Secondary | ICD-10-CM

## 2020-08-27 DIAGNOSIS — R319 Hematuria, unspecified: Secondary | ICD-10-CM | POA: Diagnosis not present

## 2020-08-27 DIAGNOSIS — E782 Mixed hyperlipidemia: Secondary | ICD-10-CM

## 2020-08-27 DIAGNOSIS — Z789 Other specified health status: Secondary | ICD-10-CM

## 2020-08-27 DIAGNOSIS — F40298 Other specified phobia: Secondary | ICD-10-CM

## 2020-08-27 DIAGNOSIS — F1090 Alcohol use, unspecified, uncomplicated: Secondary | ICD-10-CM

## 2020-08-27 DIAGNOSIS — Z7289 Other problems related to lifestyle: Secondary | ICD-10-CM

## 2020-08-27 LAB — POCT URINALYSIS DIPSTICK
Bilirubin, UA: NEGATIVE
Glucose, UA: NEGATIVE
Ketones, UA: NEGATIVE
Leukocytes, UA: NEGATIVE
Nitrite, UA: NEGATIVE
Protein, UA: POSITIVE — AB
Spec Grav, UA: 1.025 (ref 1.010–1.025)
Urobilinogen, UA: 0.2 E.U./dL
pH, UA: 6 (ref 5.0–8.0)

## 2020-08-27 MED ORDER — COLCHICINE 0.6 MG PO TABS: 0.6000 mg | ORAL_TABLET | ORAL | 0 refills | Status: AC | PRN

## 2020-08-27 MED ORDER — ALLOPURINOL 300 MG PO TABS: 300.0000 mg | ORAL_TABLET | Freq: Every day | ORAL | 3 refills | Status: AC

## 2020-08-27 NOTE — Progress Notes (Signed)
Subjective:    Patient ID: Kyle Mcbride, male    DOB: 28-Apr-1960, 60 y.o.   MRN: [id number]  Chief Complaint  Patient presents with   Gout    Gout flare up in left hand. Needs refills on medications.    HPI Patient was seen today for medication refills on gout med.  Pt lost to follow-up.  Last seen in 2020.  Patient endorses edema and pain of left hand 2/2 gout flare x2 weeks.  Able to wear a brace on left wrist, but notes anything touching the hand causes pain. Pt eating bacon, ham, drinking 6 pk beer per day.  Does not typically drink water, but started drinking a few bottles since hand started hurting.  Pt was eating shrimp, but states it caused rectal bleeding so he stopped.  Pt has not had a colonoscopy.  Pt has a phobia of needles.  Pt endorses h/o single kidney, but no imaging or recent labs available.  Still smoking cigarettes, 1/2 ppd.  Pt retired from the [employer] after 30 years.  Past Medical History:  Diagnosis Date   Gout     No Known Allergies  ROS General: Denies fever, chills, night sweats, changes in weight, changes in appetite HEENT: Denies headaches, ear pain, changes in vision, rhinorrhea, sore throat CV: Denies CP, palpitations, SOB, orthopnea Pulm: Denies SOB, cough, wheezing GI: Denies abdominal pain, nausea, vomiting, diarrhea, constipation +recent rectal bleeding, now resolved. GU: Denies dysuria, hematuria, frequency Msk: Denies muscle cramps, joint pains  +L hand edema and pain, L elbow pain with certain movements. Neuro: Denies weakness, numbness, tingling Skin: Denies rashes, bruising Psych: Denies depression, anxiety, hallucinations     Objective:    Blood pressure 112/88, pulse (!) 107, temperature 98.9 F (37.2 C), temperature source Oral, weight 162 lb 3.2 oz (73.6 kg), SpO2 99 %.  Gen. Pleasant, well-nourished, in no distress, normal affect   HEENT: [place]/AT, face symmetric, conjunctiva clear, no scleral icterus, PERRLA, EOMI, nares patent  without drainage Lungs: no accessory muscle use, CTAB, no wheezes or rales Cardiovascular: Tachycardia, no m/r/g, no peripheral edema Musculoskeletal: Significant L hand edema.  L wrist brace in place.  No cyanosis or clubbing, normal tone Neuro:  A&Ox3, CN II-XII intact, normal gait Skin:  Warm, no lesions/ rash   Wt Readings from Last 3 Encounters:  08/27/20 162 lb 3.2 oz (73.6 kg)  02/06/20 145 lb (65.8 kg)  01/12/18 190 lb (86.2 kg)    Lab Results  Component Value Date   WBC 5.8 01/12/2018   HGB 15.6 01/12/2018   HCT 46.9 01/12/2018   PLT 239.0 01/12/2018   GLUCOSE 108 (H) 01/12/2018   CHOL 221 (H) 01/12/2018   TRIG 259.0 (H) 01/12/2018   HDL 49.00 01/12/2018   LDLDIRECT 132.0 01/12/2018   ALT 22 01/12/2018   AST 23 01/12/2018   NA 137 01/12/2018   K 3.9 01/12/2018   CL 104 01/12/2018   CREATININE 1.22 01/12/2018   BUN 11 01/12/2018   CO2 23 01/12/2018   TSH 1.19 01/12/2018   HGBA1C 5.8 01/12/2018    Assessment/Plan:  Acute gout of left hand, unspecified cause  -Discussed the importance of lifestyle modifications including decreasing intake of foods high in purines -Patient encouraged to drink more water daily -We will restart colchicine for acute gout flare.  After resolution restart allopurinol. - Plan: Uric Acid, CBC with Differential/Platelet, colchicine 0.6 MG tablet  Congenital single kidney  -per chart review no imaging available to confirm. -Discussed  the importance of drinking plenty of water daily -Avoid nephrotoxic meds and limit NSAID use - Plan: Comprehensive metabolic panel, POCT urinalysis dipstick, Vitamin D, 25-hydroxy, US Renal  Screen for colon cancer  - Plan: Ambulatory referral to Gastroenterology  History of rectal bleeding -resolved -Place referral to GI for colonoscopy - Plan: Ambulatory referral to Gastroenterology  Needle phobia  History of gout -Restart allopurinol daily after resolution of current flare. -Plan:  Allopurinol  Mixed hyperlipidemia  -Triglycerides 259, total cholesterol 221, and LDL elevated on 01/12/2018. -Lifestyle modifications - Plan: Lipid panel  Alcohol use  -Discussed cutting down - Plan: Comprehensive metabolic panel  Nicotine dependence with current use -Smoking cessation counseling greater than 3 minutes, less than 10 minutes -Discussed the importance of cutting down on cigarette use -Patient to consider -We will reevaluate at each visit.  Hematuria -Noted on UA -Plan: Urine culture  F/u in 3-4 months, sooner if needed  More than 50% of over 37 minutes spent in total in caring for this patient face-to-face, reviewing the chart, and counseling and/or coordinating care.   Abbe Amsterdam, MD

## 2020-08-28 LAB — COMPREHENSIVE METABOLIC PANEL
ALT: 18 U/L (ref 0–53)
AST: 21 U/L (ref 0–37)
Albumin: 3.8 g/dL (ref 3.5–5.2)
Alkaline Phosphatase: 69 U/L (ref 39–117)
BUN: 12 mg/dL (ref 6–23)
CO2: 25 mEq/L (ref 19–32)
Calcium: 9.3 mg/dL (ref 8.4–10.5)
Chloride: 103 mEq/L (ref 96–112)
Creatinine, Ser: 1.08 mg/dL (ref 0.40–1.50)
GFR: 75.18 mL/min (ref >60.00)
Glucose, Bld: 82 mg/dL (ref 70–99)
Potassium: 3.8 mEq/L (ref 3.5–5.1)
Sodium: 139 mEq/L (ref 135–145)
Total Bilirubin: 0.8 mg/dL (ref 0.2–1.2)
Total Protein: 7 g/dL (ref 6.0–8.3)

## 2020-08-28 LAB — LIPID PANEL
Cholesterol: 195 mg/dL (ref 0–200)
HDL: 65.5 mg/dL (ref >39.00)
LDL Cholesterol: 104 mg/dL — ABNORMAL HIGH (ref 0–99)
NonHDL: 129.81
Total CHOL/HDL Ratio: 3
Triglycerides: 131 mg/dL (ref 0.0–149.0)
VLDL: 26.2 mg/dL (ref 0.0–40.0)

## 2020-08-28 LAB — CBC WITH DIFFERENTIAL/PLATELET
Basophils Absolute: 0 10*3/uL (ref 0.0–0.1)
Basophils Relative: 0.5 % (ref 0.0–3.0)
Eosinophils Absolute: 0 10*3/uL (ref 0.0–0.7)
Eosinophils Relative: 0.6 % (ref 0.0–5.0)
HCT: 40.8 % (ref 39.0–52.0)
Hemoglobin: 13.5 g/dL (ref 13.0–17.0)
Lymphocytes Relative: 16.4 % (ref 12.0–46.0)
Lymphs Abs: 1.3 10*3/uL (ref 0.7–4.0)
MCHC: 33.1 g/dL (ref 30.0–36.0)
MCV: 93.8 fl (ref 78.0–100.0)
Monocytes Absolute: 0.8 10*3/uL (ref 0.1–1.0)
Monocytes Relative: 9.8 % (ref 3.0–12.0)
Neutro Abs: 5.7 10*3/uL (ref 1.4–7.7)
Neutrophils Relative %: 72.7 % (ref 43.0–77.0)
Platelets: 265 10*3/uL (ref 150.0–400.0)
RBC: 4.35 Mil/uL (ref 4.22–5.81)
RDW: 13.7 % (ref 11.5–15.5)
WBC: 7.8 10*3/uL (ref 4.0–10.5)

## 2020-08-28 LAB — URIC ACID: Uric Acid, Serum: 8.1 mg/dL — ABNORMAL HIGH (ref 4.0–7.8)

## 2020-08-28 LAB — URINE CULTURE
MICRO NUMBER:: 12273411
Result:: NO GROWTH
SPECIMEN QUALITY:: ADEQUATE

## 2020-08-28 LAB — VITAMIN D 25 HYDROXY (VIT D DEFICIENCY, FRACTURES): VITD: 12.78 ng/mL — ABNORMAL LOW (ref 30.00–100.00)

## 2020-08-29 ENCOUNTER — Ambulatory Visit (HOSPITAL_BASED_OUTPATIENT_CLINIC_OR_DEPARTMENT_OTHER): Payer: Federal, State, Local not specified - PPO

## 2020-08-30 ENCOUNTER — Other Ambulatory Visit: Payer: Self-pay | Admitting: Family Medicine

## 2020-08-30 DIAGNOSIS — E559 Vitamin D deficiency, unspecified: Secondary | ICD-10-CM

## 2020-08-30 MED ORDER — VITAMIN D (ERGOCALCIFEROL) 1.25 MG (50000 UNIT) PO CAPS: 50000.0000 [IU] | ORAL_CAPSULE | ORAL | 0 refills | Status: AC

## 2020-09-05 ENCOUNTER — Ambulatory Visit (HOSPITAL_BASED_OUTPATIENT_CLINIC_OR_DEPARTMENT_OTHER): Payer: Federal, State, Local not specified - PPO

## 2020-11-13 ENCOUNTER — Other Ambulatory Visit: Payer: Self-pay

## 2020-11-14 ENCOUNTER — Ambulatory Visit (INDEPENDENT_AMBULATORY_CARE_PROVIDER_SITE_OTHER): Payer: Federal, State, Local not specified - PPO

## 2020-11-14 ENCOUNTER — Ambulatory Visit: Payer: Federal, State, Local not specified - PPO | Admitting: Family Medicine

## 2020-11-14 ENCOUNTER — Encounter: Payer: Self-pay | Admitting: Family Medicine

## 2020-11-14 VITALS — BP 118/80 | HR 122 | Temp 98.4°F | Wt 164.6 lb

## 2020-11-14 DIAGNOSIS — M25562 Pain in left knee: Secondary | ICD-10-CM

## 2020-11-14 DIAGNOSIS — Z8739 Personal history of other diseases of the musculoskeletal system and connective tissue: Secondary | ICD-10-CM | POA: Diagnosis not present

## 2020-11-14 DIAGNOSIS — M25561 Pain in right knee: Secondary | ICD-10-CM

## 2020-11-14 DIAGNOSIS — M25462 Effusion, left knee: Secondary | ICD-10-CM | POA: Diagnosis not present

## 2020-11-14 DIAGNOSIS — R000 Tachycardia, unspecified: Secondary | ICD-10-CM

## 2020-11-14 DIAGNOSIS — F40298 Other specified phobia: Secondary | ICD-10-CM

## 2020-11-14 DIAGNOSIS — M25461 Effusion, right knee: Secondary | ICD-10-CM | POA: Diagnosis not present

## 2020-11-14 MED ORDER — PREDNISONE 10 MG PO TABS: ORAL_TABLET | ORAL | 0 refills | Status: AC

## 2020-11-14 NOTE — Progress Notes (Signed)
Subjective:    Patient ID: Kyle Mcbride, male    DOB: 09/26/1960, 60 y.o.   MRN: [id number]  Chief Complaint  Patient presents with   Knee Pain    Knee pain, swelling, ibuprofen not helping. Going on 3 weeks    HPI Patient is a 60 yo male with pmh sig for gout, pt reported congenital single kidney, tobacco use who was seen today for ongoing concnern.  Pt with B/l knee pain R>>L.  L knee pain started 3 wks ago.  Pt denies injury, hearing any pops, clicks or tears, or edema.   R knee started hurting after trying to get in the bed 2 wks ago.  Pt endorses R knee pain, edema, and inability to bend it.  OTC Ibuprofen not helping.  Pt has a h/o gout typically in hand.  States this feels different from gout.  Past Medical History:  Diagnosis Date   Gout     No Known Allergies  ROS General: Denies fever, chills, night sweats, changes in weight, changes in appetite HEENT: Denies headaches, ear pain, changes in vision, rhinorrhea, sore throat CV: Denies CP, palpitations, SOB, orthopnea Pulm: Denies SOB, cough, wheezing GI: Denies abdominal pain, nausea, vomiting, diarrhea, constipation GU: Denies dysuria, hematuria, frequency  Msk: Denies muscle cramps, joint pains +b/l knee pain R >>L. R knee edema Neuro: Denies weakness, numbness, tingling Skin: Denies rashes, bruising Psych: Denies depression, anxiety, hallucinations     Objective:    Blood pressure 118/80, pulse (!) 122, temperature 98.4 F (36.9 C), temperature source Oral, weight 164 lb 9.6 oz (74.7 kg), SpO2 98 %.  Gen. Pleasant, well-nourished, in no distress, normal affect   HEENT: [place]/AT, face symmetric, conjunctiva clear, no scleral icterus, PERRLA, EOMI, nares patent without drainage Lungs: no accessory muscle use, CTAB, no wheezes or rales Cardiovascular: Tachycardia, no m/r/g, no peripheral edema.  No calf tenderness. Musculoskeletal: R knee edema without effusion, inability to flex.  TTP of R lateral knee.  L knee without  crepitus, effusion, erythema, or increased warmth. L knee TTP of superior patellar tendon/inferior quads.  No cyanosis or clubbing, normal tone Neuro:  A&Ox3, CN II-XII intact, normal gait Skin:  Warm, no lesions/ rash   Wt Readings from Last 3 Encounters:  11/14/20 164 lb 9.6 oz (74.7 kg)  08/27/20 162 lb 3.2 oz (73.6 kg)  02/06/20 145 lb (65.8 kg)    Lab Results  Component Value Date   WBC 7.8 08/27/2020   HGB 13.5 08/27/2020   HCT 40.8 08/27/2020   PLT 265.0 08/27/2020   GLUCOSE 82 08/27/2020   CHOL 195 08/27/2020   TRIG 131.0 08/27/2020   HDL 65.50 08/27/2020   LDLDIRECT 132.0 01/12/2018   LDLCALC 104 (H) 08/27/2020   ALT 18 08/27/2020   AST 21 08/27/2020   NA 139 08/27/2020   K 3.8 08/27/2020   CL 103 08/27/2020   CREATININE 1.08 08/27/2020   BUN 12 08/27/2020   CO2 25 08/27/2020   TSH 1.19 01/12/2018   HGBA1C 5.8 01/12/2018    Assessment/Plan:  Acute pain of right knee  -discussed possible causes including arthritis, tendinitis, bursitis, cartilage derangement, gout, etc -edema without effusion of knee -Discussed obtaining imaging -Start prednisone taper.  Limit NSAID use 2/2 history of congenital single kidney as reported by patient -Discussed potential treatment options depending on imaging such as arthrocentesis.  -declines labs at this time 2/2 needle phobia -Also discussed follow-up with Ortho.  Patient wishes to wait at this time. - Plan:  DG Knee Complete 4 Views Right, predniSONE (DELTASONE) 10 MG tablet  Acute pain of left knee  -discussed symptoms likely 2/2 arthritis vs tendinitis, or other causes -continue supportive care including heat, ice, stretching, topical analgesics -Limit NSAID use 2/2 pt reported history of congenital single kidney - Plan: DG Knee Complete 4 Views Left  History of gout -stable -Declines labs at this time  Tachycardia -Likely 2/2 pain with ambulation -Recheck  Needle phobia  F/u prn  Abbe Amsterdam, MD

## 2020-11-20 ENCOUNTER — Other Ambulatory Visit: Payer: Self-pay | Admitting: Family Medicine

## 2020-11-20 DIAGNOSIS — M25561 Pain in right knee: Secondary | ICD-10-CM

## 2020-11-20 DIAGNOSIS — N529 Male erectile dysfunction, unspecified: Secondary | ICD-10-CM

## 2020-11-20 DIAGNOSIS — E559 Vitamin D deficiency, unspecified: Secondary | ICD-10-CM

## 2020-11-21 ENCOUNTER — Telehealth: Payer: Self-pay | Admitting: Family Medicine

## 2020-11-21 NOTE — Telephone Encounter (Signed)
Pt is calling to let dr banks the pharm only gave him 10 prednisone pill instead of 15 pills

## 2020-11-21 NOTE — Telephone Encounter (Signed)
Spoke with patient about results from xray, pt stated that he does not want a needle going in his knee, the prednisone has been helping.

## 2020-11-21 NOTE — Telephone Encounter (Signed)
Pt is return taylasia concerning knee xray result

## 2020-11-26 NOTE — Telephone Encounter (Signed)
Patient was given prednisone taper.  A prescription was for prednisone 10 mg tabs.  Patient should not be given more than 10 pills.  Would advise patient to check pharmacy regarding this.

## 2021-03-14 ENCOUNTER — Other Ambulatory Visit: Payer: Self-pay | Admitting: Family Medicine

## 2021-03-14 DIAGNOSIS — E559 Vitamin D deficiency, unspecified: Secondary | ICD-10-CM

## 2022-12-12 ENCOUNTER — Other Ambulatory Visit: Payer: Self-pay | Admitting: Family Medicine

## 2022-12-12 DIAGNOSIS — Z8739 Personal history of other diseases of the musculoskeletal system and connective tissue: Secondary | ICD-10-CM
